# Patient Record
Sex: Female | Born: 1995 | Race: Black or African American | Hispanic: No | Marital: Single | Smoking: Former smoker
Health system: Southern US, Community
[De-identification: ages and names within clinical notes are randomized; demographics above are authoritative.]

## PROBLEM LIST (undated history)

## (undated) DIAGNOSIS — I82409 Acute embolism and thrombosis of unspecified deep veins of unspecified lower extremity: Secondary | ICD-10-CM

## (undated) DIAGNOSIS — D649 Anemia, unspecified: Secondary | ICD-10-CM

## (undated) DIAGNOSIS — J45909 Unspecified asthma, uncomplicated: Secondary | ICD-10-CM

## (undated) DIAGNOSIS — Z8619 Personal history of other infectious and parasitic diseases: Secondary | ICD-10-CM

## (undated) DIAGNOSIS — N12 Tubulo-interstitial nephritis, not specified as acute or chronic: Secondary | ICD-10-CM

## (undated) HISTORY — DX: Tubulo-interstitial nephritis, not specified as acute or chronic: N12

## (undated) HISTORY — DX: Anemia, unspecified: D64.9

## (undated) HISTORY — PX: NO PAST SURGERIES: SHX2092

## (undated) HISTORY — DX: Personal history of other infectious and parasitic diseases: Z86.19

---
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
## 2014-01-12 DIAGNOSIS — Z87440 Personal history of urinary (tract) infections: Secondary | ICD-10-CM | POA: Insufficient documentation

## 2014-01-12 DIAGNOSIS — Z86718 Personal history of other venous thrombosis and embolism: Secondary | ICD-10-CM | POA: Insufficient documentation

## 2014-06-07 DIAGNOSIS — O99013 Anemia complicating pregnancy, third trimester: Secondary | ICD-10-CM | POA: Insufficient documentation

## 2014-06-28 DIAGNOSIS — IMO0002 Reserved for concepts with insufficient information to code with codable children: Secondary | ICD-10-CM

## 2014-08-20 ENCOUNTER — Encounter: Payer: Self-pay | Admitting: Emergency Medicine

## 2014-08-20 ENCOUNTER — Emergency Department: Payer: Medicaid Other

## 2014-08-20 ENCOUNTER — Emergency Department
Admission: EM | Admit: 2014-08-20 | Discharge: 2014-08-21 | Disposition: A | Discharge status: Home / Self Care | Payer: Medicaid Other | Attending: Emergency Medicine | Admitting: Emergency Medicine

## 2014-08-20 DIAGNOSIS — Z3202 Encounter for pregnancy test, result negative: Secondary | ICD-10-CM | POA: Diagnosis not present

## 2014-08-20 DIAGNOSIS — R112 Nausea with vomiting, unspecified: Secondary | ICD-10-CM | POA: Insufficient documentation

## 2014-08-20 DIAGNOSIS — N12 Tubulo-interstitial nephritis, not specified as acute or chronic: Secondary | ICD-10-CM

## 2014-08-20 DIAGNOSIS — R509 Fever, unspecified: Secondary | ICD-10-CM

## 2014-08-20 LAB — URINALYSIS COMPLETE WITH MICROSCOPIC (ARMC ONLY)
Bilirubin Urine: NEGATIVE
Glucose, UA: NEGATIVE mg/dL
Ketones, ur: NEGATIVE mg/dL
Nitrite: POSITIVE — AB
PH: 5 (ref 5.0–8.0)
Protein, ur: 100 mg/dL — AB
Specific Gravity, Urine: 1.015 (ref 1.005–1.030)
TRANS EPITHEL UA: 2

## 2014-08-20 LAB — CBC
HCT: 36.4 % (ref 35.0–47.0)
Hemoglobin: 11 g/dL — ABNORMAL LOW (ref 12.0–16.0)
MCH: 22.6 pg — ABNORMAL LOW (ref 26.0–34.0)
MCHC: 30 g/dL — ABNORMAL LOW (ref 32.0–36.0)
MCV: 75.3 fL — ABNORMAL LOW (ref 80.0–100.0)
Platelets: 234 10*3/uL (ref 150–440)
RBC: 4.84 MIL/uL (ref 3.80–5.20)
RDW: 14.5 % (ref 11.5–14.5)
WBC: 12.4 10*3/uL — ABNORMAL HIGH (ref 3.6–11.0)

## 2014-08-20 LAB — BASIC METABOLIC PANEL
ANION GAP: 9 (ref 5–15)
BUN: 10 mg/dL (ref 6–20)
CO2: 25 mmol/L (ref 22–32)
CREATININE: 1 mg/dL (ref 0.44–1.00)
Calcium: 8.9 mg/dL (ref 8.9–10.3)
Chloride: 103 mmol/L (ref 101–111)
GFR calc Af Amer: >60 mL/min (ref >60)
GFR calc non Af Amer: >60 mL/min (ref >60)
Glucose, Bld: 102 mg/dL — ABNORMAL HIGH (ref 65–99)
POTASSIUM: 3.1 mmol/L — AB (ref 3.5–5.1)
SODIUM: 137 mmol/L (ref 135–145)

## 2014-08-20 LAB — POCT RAPID STREP A: Streptococcus, Group A Screen (Direct): NEGATIVE

## 2014-08-20 LAB — POCT PREGNANCY, URINE: PREG TEST UR: NEGATIVE

## 2014-08-20 MED ORDER — POTASSIUM CHLORIDE CRYS ER 20 MEQ PO TBCR
40.0000 meq | EXTENDED_RELEASE_TABLET | Freq: Once | ORAL | Status: AC
  Administered 2014-08-20: 40 meq via ORAL

## 2014-08-20 MED ORDER — ONDANSETRON HCL 4 MG/2ML IJ SOLN
4.0000 mg | Freq: Once | INTRAMUSCULAR | Status: AC
  Administered 2014-08-21: 4 mg via INTRAVENOUS

## 2014-08-20 MED ORDER — CEFTRIAXONE SODIUM IN DEXTROSE 40 MG/ML IV SOLN
2.0000 g | INTRAVENOUS | Status: DC
  Administered 2014-08-20: 2 g via INTRAVENOUS
  Filled 2014-08-20: qty 50

## 2014-08-20 MED ORDER — SODIUM CHLORIDE 0.9 % IV BOLUS (SEPSIS)
1000.0000 mL | Freq: Once | INTRAVENOUS | Status: AC
  Administered 2014-08-20: 1000 mL via INTRAVENOUS

## 2014-08-20 MED ORDER — ACETAMINOPHEN 500 MG PO TABS
ORAL_TABLET | ORAL | Status: AC
  Administered 2014-08-20: 1000 mg via ORAL
  Filled 2014-08-20: qty 2

## 2014-08-20 MED ORDER — ACETAMINOPHEN 500 MG PO TABS
1000.0000 mg | ORAL_TABLET | Freq: Once | ORAL | Status: AC
Start: 2014-08-20 — End: 2014-08-20
  Administered 2014-08-20: 1000 mg via ORAL

## 2014-08-20 NOTE — ED Notes (Signed)
Pt reports fever x 3 days.  Pt reports back pain, abd pain, HA.  Pt reports n/v, 3x in past 24 hours.  Pt denies diarrhea.  Pt denies respiratory sx.  Pt NAD at this time.

## 2014-08-20 NOTE — ED Provider Notes (Signed)
[facility] Emergency Department Provider Note  ____________________________________________  Time seen: Approximately 11:50 PM  I have reviewed the triage vital signs and the nursing notes.   HISTORY  Chief Complaint Fever    HPI Kathryn Johnston is a 19 y.o. female who presents to the ED with a 3 day history of fever, chills, lower back pain, abdominal pain, headache, 3 episodes of nausea and vomiting. Patient denies sick contacts. Denies cough, congestion, shortness of breath, chest pain, dysuria, diarrhea, recent tick bite.   Past medical history History of DVT   There are no active problems to display for this patient.   History reviewed. No pertinent past surgical history.  No current outpatient prescriptions on file.  Allergies Ibuprofen  History reviewed. No pertinent family history.  Social History History  Substance Use Topics  . Smoking status: Never Smoker   . Smokeless tobacco: Never Used  . Alcohol Use: No    Review of Systems Constitutional: Positive for fever/chills Eyes: No visual changes. ENT: No sore throat. Cardiovascular: Denies chest pain. Respiratory: Denies shortness of breath. Gastrointestinal: Positive for abdominal pain.  Positive for nausea and vomiting.  No diarrhea.  No constipation. Genitourinary: Negative for dysuria. Musculoskeletal: Positive for back pain. Skin: Negative for rash. Neurological: Positive for headaches. Negative for focal weakness or numbness.  10-point ROS otherwise negative.  ____________________________________________   PHYSICAL EXAM:  VITAL SIGNS: ED Triage Vitals  Enc Vitals Group     BP 08/20/14 1948 109/70 mmHg     Pulse Rate 08/20/14 1948 128     Resp 08/20/14 1948 18     Temp 08/20/14 1948 103.2 F (39.6 C)     Temp Source 08/20/14 1948 Oral     SpO2 08/20/14 1948 100 %     Weight 08/20/14 1948 119 lb (53.978 kg)     Height 08/20/14 1948 5\' 2"  (1.575 m)     Head  Cir --      Peak Flow --      Pain Score 08/20/14 1949 10     Pain Loc --      Pain Edu? --      Excl. in GC? --     Constitutional: Alert and oriented. Well appearing and in no acute distress. Eyes: Conjunctivae are normal. PERRL. EOMI. Head: Atraumatic. Nose: No congestion/rhinnorhea. Mouth/Throat: Mucous membranes are moist.  Oropharynx non-erythematous. Neck: No stridor. Supple neck without signs of meningismus. Cardiovascular: Normal rate, regular rhythm. Grossly normal heart sounds.  Good peripheral circulation. Respiratory: Normal respiratory effort.  No retractions. Lungs CTAB. Gastrointestinal: Soft and nontender. No distention. No abdominal bruits. No CVA tenderness. Musculoskeletal: No lower extremity tenderness nor edema.  No joint effusions. Neurologic:  Normal speech and language. No gross focal neurologic deficits are appreciated. Speech is normal. No gait instability. Skin:  Skin is warm, dry and intact. No rash noted. Psychiatric: Mood and affect are normal. Speech and behavior are normal.  ____________________________________________   LABS (all labs ordered are listed, but only abnormal results are displayed)  Labs Reviewed  CBC - Abnormal; Notable for the following:    WBC 12.4 (*)    Hemoglobin 11.0 (*)    MCV 75.3 (*)    MCH 22.6 (*)    MCHC 30.0 (*)    All other components within normal limits  BASIC METABOLIC PANEL - Abnormal; Notable for the following:    Potassium 3.1 (*)    Glucose, Bld 102 (*)    All other components within  normal limits  URINALYSIS COMPLETEWITH MICROSCOPIC ([facility] ONLY) - Abnormal; Notable for the following:    Color, Urine YELLOW (*)    APPearance CLOUDY (*)    Hgb urine dipstick 1+ (*)    Protein, ur 100 (*)    Nitrite POSITIVE (*)    Leukocytes, UA 3+ (*)    Bacteria, UA MANY (*)    Squamous Epithelial / LPF 6-30 (*)    All other components within normal limits  CULTURE, GROUP A STREP ([facility] ONLY)  URINE CULTURE  ROCKY  MTN SPOTTED FVR ABS PNL(IGG+IGM)  POC URINE PREG, ED  POCT RAPID STREP A  POCT PREGNANCY, URINE   ____________________________________________  EKG  None ____________________________________________  RADIOLOGY  Chest 2 view (viewed by me, interpreted by Dr. Cherly Hensen): No acute cardiopulmonary process seen.  ____________________________________________   PROCEDURES  Procedure(s) performed: None  Critical Care performed: No  ____________________________________________   INITIAL IMPRESSION / ASSESSMENT AND PLAN / ED COURSE  Pertinent labs & imaging results that were available during my care of the patient were reviewed by me and considered in my medical decision making (see chart for details).  19 year old female who presents with fever, chills, low back pain, nausea/vomiting with laboratory evidence of pyelonephritis and mild hypokalemia. Will infuse IV fluids, IV antiemetic, IV antibiotics and reassess.  ----------------------------------------- 2:08 AM on 08/21/2014 -----------------------------------------  Patient improved after IV morphine. Currently resting comfortably in no acute distress. IV fluids almost complete. Patient has tolerated PO, is currently afebrile and not hypotensive. Plan to discharge home on Septra and follow up with her primary care doctor. Strict return precautions given. Patient verbalizes understanding and agrees with plan of care. ____________________________________________   FINAL CLINICAL IMPRESSION(S) / ED DIAGNOSES  Final diagnoses:  Fever, unspecified fever cause  Pyelonephritis      Kathryn Hong, MD 08/21/14 [phone]

## 2014-08-20 NOTE — ED Notes (Signed)
EMS pt to triage via wheelchair. Pt reports body aches and fever for about 4 to 5 days. Pt reports she also has a non productive cough and feels weak.

## 2014-08-21 MED ORDER — ONDANSETRON HCL 4 MG/2ML IJ SOLN
4.0000 mg | Freq: Once | INTRAMUSCULAR | Status: AC
  Administered 2014-08-21: 4 mg via INTRAVENOUS

## 2014-08-21 MED ORDER — MORPHINE SULFATE 2 MG/ML IJ SOLN
INTRAMUSCULAR | Status: AC
  Administered 2014-08-21: 2 mg via INTRAVENOUS
  Filled 2014-08-21: qty 1

## 2014-08-21 MED ORDER — ONDANSETRON HCL 4 MG/2ML IJ SOLN
INTRAMUSCULAR | Status: AC
  Administered 2014-08-21: 4 mg via INTRAVENOUS
  Filled 2014-08-21: qty 2

## 2014-08-21 MED ORDER — ONDANSETRON HCL 4 MG PO TABS: 4.0000 mg | ORAL_TABLET | Freq: Three times a day (TID) | ORAL | Status: DC | PRN

## 2014-08-21 MED ORDER — HYDROCODONE-ACETAMINOPHEN 5-325 MG PO TABS: 1.0000 | ORAL_TABLET | Freq: Four times a day (QID) | ORAL | Status: DC | PRN

## 2014-08-21 MED ORDER — POTASSIUM CHLORIDE CRYS ER 20 MEQ PO TBCR
EXTENDED_RELEASE_TABLET | ORAL | Status: AC
  Administered 2014-08-20: 40 meq via ORAL
  Filled 2014-08-21: qty 2

## 2014-08-21 MED ORDER — MORPHINE SULFATE 2 MG/ML IJ SOLN
2.0000 mg | Freq: Once | INTRAMUSCULAR | Status: AC
  Administered 2014-08-21: 2 mg via INTRAVENOUS

## 2014-08-21 MED ORDER — SULFAMETHOXAZOLE-TRIMETHOPRIM 800-160 MG PO TABS: 1.0000 | ORAL_TABLET | Freq: Two times a day (BID) | ORAL | Status: DC

## 2014-08-21 NOTE — Discharge Instructions (Signed)
1. Take antibiotic as prescribed (Septra DS twice daily 7 days). 2. Take medicines as needed for pain and nausea (Norco/Zofran #20). 3. Take Tylenol as needed for fever greater than 100.56F. 4. Drink plenty of fluids daily. 5. Return to the ER for worsening symptoms, persistent vomiting, difficulty breathing or other concerns.  Fever, Adult A fever is a higher than normal body temperature. In an adult, an oral temperature around 98.6 F (37 C) is considered normal. A temperature of 100.4 F (38 C) or higher is generally considered a fever. Mild or moderate fevers generally have no long-term effects and often do not require treatment. Extreme fever (greater than or equal to 106 F or 41.1 C) can cause seizures. The sweating that may occur with repeated or prolonged fever may cause dehydration. Elderly people can develop confusion during a fever. A measured temperature can vary with:  Age.  Time of day.  Method of measurement (mouth, underarm, rectal, or ear). The fever is confirmed by taking a temperature with a thermometer. Temperatures can be taken different ways. Some methods are accurate and some are not.  An oral temperature is used most commonly. Electronic thermometers are fast and accurate.  An ear temperature will only be accurate if the thermometer is positioned as recommended by the manufacturer.  A rectal temperature is accurate and done for those adults who have a condition where an oral temperature cannot be taken.  An underarm (axillary) temperature is not accurate and not recommended. Fever is a symptom, not a disease.  CAUSES   Infections commonly cause fever.  Some noninfectious causes for fever include:  Some arthritis conditions.  Some thyroid or adrenal gland conditions.  Some immune system conditions.  Some types of cancer.  A medicine reaction.  High doses of certain street drugs such as methamphetamine.  Dehydration.  Exposure to high outside or  room temperatures.  Occasionally, the source of a fever cannot be determined. This is sometimes called a "fever of unknown origin" (FUO).  Some situations may lead to a temporary rise in body temperature that may go away on its own. Examples are:  Childbirth.  Surgery.  Intense exercise. HOME CARE INSTRUCTIONS   Take appropriate medicines for fever. Follow dosing instructions carefully. If you use acetaminophen to reduce the fever, be careful to avoid taking other medicines that also contain acetaminophen. Do not take aspirin for a fever if you are younger than age 519. There is an association with Reye's syndrome. Reye's syndrome is a rare but potentially deadly disease.  If an infection is present and antibiotics have been prescribed, take them as directed. Finish them even if you start to feel better.  Rest as needed.  Maintain an adequate fluid intake. To prevent dehydration during an illness with prolonged or recurrent fever, you may need to drink extra fluid.Drink enough fluids to keep your urine clear or pale yellow.  Sponging or bathing with room temperature water may help reduce body temperature. Do not use ice water or alcohol sponge baths.  Dress comfortably, but do not over-bundle. SEEK MEDICAL CARE IF:   You are unable to keep fluids down.  You develop vomiting or diarrhea.  You are not feeling at least partly better after 3 days.  You develop new symptoms or problems. SEEK IMMEDIATE MEDICAL CARE IF:   You have shortness of breath or trouble breathing.  You develop excessive weakness.  You are dizzy or you faint.  You are extremely thirsty or you are making little  or no urine.  You develop new pain that was not there before (such as in the head, neck, chest, back, or abdomen).  You have persistent vomiting and diarrhea for more than 1 to 2 days.  You develop a stiff neck or your eyes become sensitive to light.  You develop a skin rash.  You have a fever  or persistent symptoms for more than 2 to 3 days.  You have a fever and your symptoms suddenly get worse. MAKE SURE YOU:   Understand these instructions.  Will watch your condition.  Will get help right away if you are not doing well or get worse. Document Released: 08/01/2000 Document Revised: 06/22/2013 Document Reviewed: 12/07/2010 [facility] Patient Information 2015 [place], [place]. This information is not intended to replace advice given to you by your health care provider. Make sure you discuss any questions you have with your health care provider.  Pyelonephritis, Adult Pyelonephritis is a kidney infection. In general, there are 2 main types of pyelonephritis:  Infections that come on quickly without any warning (acute pyelonephritis).  Infections that persist for a long period of time (chronic pyelonephritis). CAUSES  Two main causes of pyelonephritis are:  Bacteria traveling from the bladder to the kidney. This is a problem especially in pregnant women. The urine in the bladder can become filled with bacteria from multiple causes, including:  Inflammation of the prostate gland (prostatitis).  Sexual intercourse in females.  Bladder infection (cystitis).  Bacteria traveling from the bloodstream to the tissue part of the kidney. Problems that may increase your risk of getting a kidney infection include:  Diabetes.  Kidney stones or bladder stones.  Cancer.  Catheters placed in the bladder.  Other abnormalities of the kidney or ureter. SYMPTOMS   Abdominal pain.  Pain in the side or flank area.  Fever.  Chills.  Upset stomach.  Blood in the urine (dark urine).  Frequent urination.  Strong or persistent urge to urinate.  Burning or stinging when urinating. DIAGNOSIS  Your caregiver may diagnose your kidney infection based on your symptoms. A urine sample may also be taken. TREATMENT  In general, treatment depends on how severe the infection is.    If the infection is mild and caught early, your caregiver may treat you with oral antibiotics and send you home.  If the infection is more severe, the bacteria may have gotten into the bloodstream. This will require intravenous (IV) antibiotics and a hospital stay. Symptoms may include:  High fever.  Severe flank pain.  Shaking chills.  Even after a hospital stay, your caregiver may require you to be on oral antibiotics for a period of time.  Other treatments may be required depending upon the cause of the infection. HOME CARE INSTRUCTIONS   Take your antibiotics as directed. Finish them even if you start to feel better.  Make an appointment to have your urine checked to make sure the infection is gone.  Drink enough fluids to keep your urine clear or pale yellow.  Take medicines for the bladder if you have urgency and frequency of urination as directed by your caregiver. SEEK IMMEDIATE MEDICAL CARE IF:   You have a fever or persistent symptoms for more than 2-3 days.  You have a fever and your symptoms suddenly get worse.  You are unable to take your antibiotics or fluids.  You develop shaking chills.  You experience extreme weakness or fainting.  There is no improvement after 2 days of treatment. MAKE SURE YOU:  Understand these instructions.  Will watch your condition.  Will get help right away if you are not doing well or get worse. Document Released: 02/05/2005 Document Revised: 08/07/2011 Document Reviewed: 07/12/2010 [facility] Patient Information 2015 [place]. This information is not intended to replace advice given to you by your health care provider. Make sure you discuss any questions you have with your health care provider.

## 2014-08-22 LAB — CULTURE, GROUP A STREP (THRC)

## 2014-08-23 LAB — URINE CULTURE
Culture: >=100000
Special Requests: NORMAL

## 2014-08-25 LAB — ROCKY MTN SPOTTED FVR ABS PNL(IGG+IGM)
RMSF IGG: NEGATIVE
RMSF IGM: 0.58 {index} (ref 0.00–0.89)

## 2014-11-17 ENCOUNTER — Encounter: Payer: Self-pay | Admitting: Emergency Medicine

## 2014-11-17 ENCOUNTER — Emergency Department
Admission: EM | Admit: 2014-11-17 | Discharge: 2014-11-18 | Disposition: A | Discharge status: Home / Self Care | Payer: Medicaid Other | Attending: Emergency Medicine | Admitting: Emergency Medicine

## 2014-11-17 DIAGNOSIS — N12 Tubulo-interstitial nephritis, not specified as acute or chronic: Secondary | ICD-10-CM | POA: Insufficient documentation

## 2014-11-17 DIAGNOSIS — Z792 Long term (current) use of antibiotics: Secondary | ICD-10-CM | POA: Insufficient documentation

## 2014-11-17 DIAGNOSIS — Z720 Tobacco use: Secondary | ICD-10-CM | POA: Insufficient documentation

## 2014-11-17 DIAGNOSIS — R109 Unspecified abdominal pain: Secondary | ICD-10-CM | POA: Diagnosis present

## 2014-11-17 DIAGNOSIS — Z3202 Encounter for pregnancy test, result negative: Secondary | ICD-10-CM | POA: Insufficient documentation

## 2014-11-17 HISTORY — DX: Unspecified asthma, uncomplicated: J45.909

## 2014-11-17 HISTORY — DX: Acute embolism and thrombosis of unspecified deep veins of unspecified lower extremity: I82.409

## 2014-11-17 LAB — BASIC METABOLIC PANEL
ANION GAP: 6 (ref 5–15)
BUN: <5 mg/dL — ABNORMAL LOW (ref 6–20)
CO2: 26 mmol/L (ref 22–32)
Calcium: 9.2 mg/dL (ref 8.9–10.3)
Chloride: 105 mmol/L (ref 101–111)
Creatinine, Ser: 0.92 mg/dL (ref 0.44–1.00)
GFR calc non Af Amer: >60 mL/min (ref >60)
GLUCOSE: 91 mg/dL (ref 65–99)
POTASSIUM: 3.5 mmol/L (ref 3.5–5.1)
Sodium: 137 mmol/L (ref 135–145)

## 2014-11-17 LAB — CBC
HEMATOCRIT: 37.6 % (ref 35.0–47.0)
HEMOGLOBIN: 11.9 g/dL — AB (ref 12.0–16.0)
MCH: 23.9 pg — ABNORMAL LOW (ref 26.0–34.0)
MCHC: 31.6 g/dL — ABNORMAL LOW (ref 32.0–36.0)
MCV: 75.7 fL — ABNORMAL LOW (ref 80.0–100.0)
Platelets: 213 10*3/uL (ref 150–440)
RBC: 4.97 MIL/uL (ref 3.80–5.20)
RDW: 13.9 % (ref 11.5–14.5)
WBC: 7 10*3/uL (ref 3.6–11.0)

## 2014-11-17 LAB — URINALYSIS COMPLETE WITH MICROSCOPIC (ARMC ONLY)
Bilirubin Urine: NEGATIVE
Glucose, UA: NEGATIVE mg/dL
KETONES UR: NEGATIVE mg/dL
Nitrite: POSITIVE — AB
PROTEIN: 30 mg/dL — AB
Specific Gravity, Urine: 1.011 (ref 1.005–1.030)
pH: 5 (ref 5.0–8.0)

## 2014-11-17 LAB — POCT PREGNANCY, URINE: Preg Test, Ur: NEGATIVE

## 2014-11-17 MED ORDER — MORPHINE SULFATE (PF) 2 MG/ML IV SOLN
2.0000 mg | Freq: Once | INTRAVENOUS | Status: AC
Start: 2014-11-17 — End: 2014-11-17
  Administered 2014-11-17: 2 mg via INTRAVENOUS
  Filled 2014-11-17: qty 1

## 2014-11-17 MED ORDER — DEXTROSE 5 % IV SOLN
1.0000 g | Freq: Once | INTRAVENOUS | Status: AC
  Administered 2014-11-17: via INTRAVENOUS
  Filled 2014-11-17: qty 10

## 2014-11-17 MED ORDER — ONDANSETRON HCL 4 MG/2ML IJ SOLN
4.0000 mg | Freq: Once | INTRAMUSCULAR | Status: AC
  Administered 2014-11-17: 4 mg via INTRAVENOUS
  Filled 2014-11-17: qty 2

## 2014-11-17 MED ORDER — IOHEXOL 240 MG/ML SOLN
25.0000 mL | INTRAMUSCULAR | Status: AC
  Administered 2014-11-17: 50 mL via ORAL

## 2014-11-17 NOTE — ED Provider Notes (Signed)
[facility] Emergency Department Provider Note  ____________________________________________  Time seen: 11:15 PM  I have reviewed the triage vital signs and the nursing notes.   HISTORY  Chief Complaint Flank Pain and Hematuria      HPI Kathryn Johnston is a 19 y.o. female presents with acute onset of right flank pain and hematuria 3 or 4 days per patient. Patient states she also had a fever at home with a MAXIMUM TEMPERATURE of 102. Patient also admits to nausea but no vomiting no diarrhea. Patient also has some dysuria and urinary frequency and urgency.     Past Medical History  Diagnosis Date  . DVT (deep venous thrombosis)     There are no active problems to display for this patient.   History reviewed. No pertinent past surgical history.  Current Outpatient Rx  Name  Route  Sig  Dispense  Refill  . HYDROcodone-acetaminophen (NORCO) 5-325 MG per tablet   Oral   Take 1 tablet by mouth every 6 (six) hours as needed for moderate pain.   15 tablet   0   . ondansetron (ZOFRAN) 4 MG tablet   Oral   Take 1 tablet (4 mg total) by mouth every 8 (eight) hours as needed for nausea or vomiting.   15 tablet   0   . sulfamethoxazole-trimethoprim (BACTRIM DS,SEPTRA DS) 800-160 MG per tablet   Oral   Take 1 tablet by mouth 2 (two) times daily.   14 tablet   0     Allergies Ibuprofen  No family history on file.  Social History Social History  Substance Use Topics  . Smoking status: Current Every Day Smoker -- 0.50 packs/day    Types: Cigarettes  . Smokeless tobacco: Never Used  . Alcohol Use: No    Review of Systems  Constitutional: Positive for fever. Eyes: Negative for visual changes. ENT: Negative for sore throat. Cardiovascular: Negative for chest pain. Respiratory: Negative for shortness of breath. Gastrointestinal: Negative for abdominal pain, vomiting and diarrhea. Genitourinary: Negative for dysuria. Musculoskeletal:  Positive for back pain. Skin: Negative for rash. Neurological: Negative for headaches, focal weakness or numbness.   10-point ROS otherwise negative.  ____________________________________________   PHYSICAL EXAM:  VITAL SIGNS: ED Triage Vitals  Enc Vitals Group     BP 11/17/14 2235 116/63 mmHg     Pulse Rate 11/17/14 2235 101     Resp 11/17/14 2235 18     Temp 11/17/14 2235 100.8 F (38.2 C)     Temp Source 11/17/14 2235 Oral     SpO2 11/17/14 2235 98 %     Weight 11/17/14 2235 119 lb (53.978 kg)     Height 11/17/14 2235  (1.575 m)     Head Cir --      Peak Flow --      Pain Score 11/17/14 2233 10     Pain Loc --      Pain Edu? --      Excl. in GC? --      Constitutional: Alert and oriented. Well appearing and in no distress. Eyes: Conjunctivae are normal. PERRL. Normal extraocular movements. ENT   Head: Normocephalic and atraumatic.   Nose: No congestion/rhinnorhea.   Mouth/Throat: Mucous membranes are moist.   Neck: No stridor. Cardiovascular: Normal rate, regular rhythm. Normal and symmetric distal pulses are present in all extremities. No murmurs, rubs, or gallops. Respiratory: Normal respiratory effort without tachypnea nor retractions. Breath sounds are clear and equal bilaterally. No wheezes/rales/rhonchi.  Gastrointestinal: Soft and nontender. No distention. Right CVA tenderness. Genitourinary: deferred Musculoskeletal: Nontender with normal range of motion in all extremities. No joint effusions.  No lower extremity tenderness nor edema. Neurologic:  Normal speech and language. No gross focal neurologic deficits are appreciated. Speech is normal.  Skin:  Skin is warm, dry and intact. No rash noted. Psychiatric: Mood and affect are normal. Speech and behavior are normal. Patient exhibits appropriate insight and judgment.  ____________________________________________    LABS (pertinent positives/negatives)  Labs Reviewed  URINALYSIS  COMPLETEWITH MICROSCOPIC ([facility] ONLY) - Abnormal; Notable for the following:    Color, Urine YELLOW (*)    APPearance HAZY (*)    Hgb urine dipstick 3+ (*)    Protein, ur 30 (*)    Nitrite POSITIVE (*)    Leukocytes, UA 3+ (*)    Bacteria, UA FEW (*)    Squamous Epithelial / LPF 6-30 (*)    All other components within normal limits  BASIC METABOLIC PANEL - Abnormal; Notable for the following:    BUN <5 (*)    All other components within normal limits  CBC - Abnormal; Notable for the following:    Hemoglobin 11.9 (*)    MCV 75.7 (*)    MCH 23.9 (*)    MCHC 31.6 (*)    All other components within normal limits  POC URINE PREG, ED  POCT PREGNANCY, URINE      RADIOLOGY     CT Abdomen Pelvis W Contrast (Final result) Result time: 11/18/14 01:38:54   Final result by Rad Results In Interface (11/18/14 01:38:54)   Narrative:   CLINICAL DATA: Pyelonephritis. Third episode since July. Patient reports right flank pain radiating into the right lower abdomen with hematuria and fever for 3-4 days.  EXAM: CT ABDOMEN AND PELVIS WITH CONTRAST  TECHNIQUE: Multidetector CT imaging of the abdomen and pelvis was performed using the standard protocol following bolus administration of intravenous contrast.  CONTRAST: 75mL OMNIPAQUE IOHEXOL 300 MG/ML SOLN  COMPARISON: None.  FINDINGS: The included lung bases are clear.  Heterogeneous enhancement of the upper right kidney with possible cortical scarring anteriorly. Mild enhancement and prominence of the mid right ureter. There is no nephrolithiasis. No intrarenal or perirenal abscess or fluid collection. Left kidney renal collecting system are normal.  The liver, gallbladder, spleen, pancreas, and adrenal glands are normal. The stomach is physiologically distended with contrast. There are no dilated or thickened bowel loops. Moderate stool burden in the right colon. No colonic wall thickening. The appendix is normal. No  free air, free fluid, or intra-abdominal fluid collection.  No retroperitoneal adenopathy. Abdominal aorta is normal in caliber.  Within the pelvis the uterus is retroverted. There is question of mild bladder wall thickening. The ovaries are not discretely visualized. Small amount of free fluid in the pelvis is physiologic in a patient of this age.  There are no acute or suspicious osseous abnormalities.  IMPRESSION: Findings suspicious for mild right pyelonephritis with questionable scarring in the upper right kidney. No abscess.   Electronically Signed By: Rubye Oaks M.D. On: 11/18/2014 01:38         INITIAL IMPRESSION / ASSESSMENT AND PLAN / ED COURSE  Pertinent labs & imaging results that were available during my care of the patient were reviewed by me and considered in my medical decision making (see chart for details).  Patient received IV ceftriaxone 1 g as well as IV morphine for analgesia. History of physical exam, CT scan consistent with acute  pyelonephritis. I reviewed the patient's last urine culture from July which revealed that she was sensitive to ciprofloxacin as such she will be prescribed the same for home.  ____________________________________________   FINAL CLINICAL IMPRESSION(S) / ED DIAGNOSES  Final diagnoses:  Pyelonephritis      Darci Current, MD 11/18/14 0225

## 2014-11-17 NOTE — ED Notes (Signed)
Patient with fever to 103 at home. Believes she may have a kidney infection. Has seen bright red blood in her urine.

## 2014-11-17 NOTE — ED Notes (Signed)
Pt to triage via w/c with no distress noted; pt reports right flank pain radiating into right lower abd accomp by hematuria and fever for 3-4 days

## 2014-11-18 ENCOUNTER — Emergency Department: Payer: Medicaid Other

## 2014-11-18 DIAGNOSIS — A419 Sepsis, unspecified organism: Secondary | ICD-10-CM | POA: Insufficient documentation

## 2014-11-18 DIAGNOSIS — N10 Acute tubulo-interstitial nephritis: Secondary | ICD-10-CM | POA: Insufficient documentation

## 2014-11-18 MED ORDER — OXYCODONE-ACETAMINOPHEN 5-325 MG PO TABS
2.0000 | ORAL_TABLET | Freq: Once | ORAL | Status: AC
  Administered 2014-11-18: 2 via ORAL
  Filled 2014-11-18: qty 2

## 2014-11-18 MED ORDER — CIPROFLOXACIN HCL 500 MG PO TABS: 500.0000 mg | ORAL_TABLET | Freq: Two times a day (BID) | ORAL | Status: AC

## 2014-11-18 MED ORDER — IOHEXOL 300 MG/ML  SOLN
75.0000 mL | Freq: Once | INTRAMUSCULAR | Status: AC | PRN
  Administered 2014-11-18: 75 mL via INTRAVENOUS

## 2014-11-18 NOTE — Discharge Instructions (Signed)
Pyelonephritis, Adult °Pyelonephritis is a kidney infection. In general, there are 2 main types of pyelonephritis: °· Infections that come on quickly without any warning (acute pyelonephritis). °· Infections that persist for a long period of time (chronic pyelonephritis). °CAUSES  °Two main causes of pyelonephritis are: °· Bacteria traveling from the bladder to the kidney. This is a problem especially in pregnant women. The urine in the bladder can become filled with bacteria from multiple causes, including: °¨ Inflammation of the prostate gland (prostatitis). °¨ Sexual intercourse in females. °¨ Bladder infection (cystitis). °· Bacteria traveling from the bloodstream to the tissue part of the kidney. °Problems that may increase your risk of getting a kidney infection include: °· Diabetes. °· Kidney stones or bladder stones. °· Cancer. °· Catheters placed in the bladder. °· Other abnormalities of the kidney or ureter. °SYMPTOMS  °· Abdominal pain. °· Pain in the side or flank area. °· Fever. °· Chills. °· Upset stomach. °· Blood in the urine (dark urine). °· Frequent urination. °· Strong or persistent urge to urinate. °· Burning or stinging when urinating. °DIAGNOSIS  °Your caregiver may diagnose your kidney infection based on your symptoms. A urine sample may also be taken. °TREATMENT  °In general, treatment depends on how severe the infection is.  °· If the infection is mild and caught early, your caregiver may treat you with oral antibiotics and send you home. °· If the infection is more severe, the bacteria may have gotten into the bloodstream. This will require intravenous (IV) antibiotics and a hospital stay. Symptoms may include: °¨ High fever. °¨ Severe flank pain. °¨ Shaking chills. °· Even after a hospital stay, your caregiver may require you to be on oral antibiotics for a period of time. °· Other treatments may be required depending upon the cause of the infection. °HOME CARE INSTRUCTIONS  °· Take your  antibiotics as directed. Finish them even if you start to feel better. °· Make an appointment to have your urine checked to make sure the infection is gone. °· Drink enough fluids to keep your urine clear or pale yellow. °· Take medicines for the bladder if you have urgency and frequency of urination as directed by your caregiver. °SEEK IMMEDIATE MEDICAL CARE IF:  °· You have a fever or persistent symptoms for more than 2-3 days. °· You have a fever and your symptoms suddenly get worse. °· You are unable to take your antibiotics or fluids. °· You develop shaking chills. °· You experience extreme weakness or fainting. °· There is no improvement after 2 days of treatment. °MAKE SURE YOU: °· Understand these instructions. °· Will watch your condition. °· Will get help right away if you are not doing well or get worse. °Document Released: 02/05/2005 Document Revised: 08/07/2011 Document Reviewed: 07/12/2010 °[employer]® Patient Information ©2015 [employer]. This information is not intended to replace advice given to you by your health care provider. Make sure you discuss any questions you have with your health care provider. ° °

## 2015-06-14 ENCOUNTER — Emergency Department
Admission: EM | Admit: 2015-06-14 | Discharge: 2015-06-14 | Disposition: A | Discharge status: Home / Self Care | Payer: Medicaid Other | Attending: Emergency Medicine | Admitting: Emergency Medicine

## 2015-06-14 ENCOUNTER — Encounter: Payer: Self-pay | Admitting: Emergency Medicine

## 2015-06-14 ENCOUNTER — Emergency Department: Payer: Medicaid Other

## 2015-06-14 DIAGNOSIS — J45909 Unspecified asthma, uncomplicated: Secondary | ICD-10-CM | POA: Insufficient documentation

## 2015-06-14 DIAGNOSIS — Z3A08 8 weeks gestation of pregnancy: Secondary | ICD-10-CM | POA: Insufficient documentation

## 2015-06-14 DIAGNOSIS — Z3490 Encounter for supervision of normal pregnancy, unspecified, unspecified trimester: Secondary | ICD-10-CM

## 2015-06-14 DIAGNOSIS — N12 Tubulo-interstitial nephritis, not specified as acute or chronic: Secondary | ICD-10-CM | POA: Diagnosis not present

## 2015-06-14 DIAGNOSIS — O26831 Pregnancy related renal disease, first trimester: Secondary | ICD-10-CM | POA: Diagnosis not present

## 2015-06-14 DIAGNOSIS — M549 Dorsalgia, unspecified: Secondary | ICD-10-CM

## 2015-06-14 DIAGNOSIS — Z86718 Personal history of other venous thrombosis and embolism: Secondary | ICD-10-CM | POA: Diagnosis not present

## 2015-06-14 DIAGNOSIS — F17210 Nicotine dependence, cigarettes, uncomplicated: Secondary | ICD-10-CM | POA: Insufficient documentation

## 2015-06-14 LAB — CBC
HEMATOCRIT: 33.3 % — AB (ref 35.0–47.0)
Hemoglobin: 10.7 g/dL — ABNORMAL LOW (ref 12.0–16.0)
MCH: 23.8 pg — AB (ref 26.0–34.0)
MCHC: 32.1 g/dL (ref 32.0–36.0)
MCV: 74.1 fL — AB (ref 80.0–100.0)
PLATELETS: 268 10*3/uL (ref 150–440)
RBC: 4.49 MIL/uL (ref 3.80–5.20)
RDW: 12.1 % (ref 11.5–14.5)
WBC: 6.2 10*3/uL (ref 3.6–11.0)

## 2015-06-14 LAB — POCT PREGNANCY, URINE: PREG TEST UR: POSITIVE — AB

## 2015-06-14 LAB — BASIC METABOLIC PANEL
ANION GAP: 6 (ref 5–15)
BUN: 10 mg/dL (ref 6–20)
CHLORIDE: 105 mmol/L (ref 101–111)
CO2: 26 mmol/L (ref 22–32)
Calcium: 9.3 mg/dL (ref 8.9–10.3)
Creatinine, Ser: 0.63 mg/dL (ref 0.44–1.00)
GFR calc Af Amer: >60 mL/min (ref >60)
GFR calc non Af Amer: >60 mL/min (ref >60)
GLUCOSE: 91 mg/dL (ref 65–99)
POTASSIUM: 3.5 mmol/L (ref 3.5–5.1)
SODIUM: 137 mmol/L (ref 135–145)

## 2015-06-14 LAB — URINALYSIS COMPLETE WITH MICROSCOPIC (ARMC ONLY)
BILIRUBIN URINE: NEGATIVE
GLUCOSE, UA: NEGATIVE mg/dL
HGB URINE DIPSTICK: NEGATIVE
Nitrite: NEGATIVE
PH: 7 (ref 5.0–8.0)
Protein, ur: 100 mg/dL — AB
Specific Gravity, Urine: 1.024 (ref 1.005–1.030)

## 2015-06-14 LAB — HCG, QUANTITATIVE, PREGNANCY: hCG, Beta Chain, Quant, S: 190149 m[IU]/mL — ABNORMAL HIGH (ref <5)

## 2015-06-14 MED ORDER — OXYCODONE-ACETAMINOPHEN 5-325 MG PO TABS: 1.0000 | ORAL_TABLET | Freq: Four times a day (QID) | ORAL | Status: DC | PRN

## 2015-06-14 MED ORDER — CEPHALEXIN 500 MG PO CAPS
500.0000 mg | ORAL_CAPSULE | Freq: Four times a day (QID) | ORAL | Status: AC
Start: 2015-06-14 — End: 2015-06-24

## 2015-06-14 MED ORDER — OXYCODONE-ACETAMINOPHEN 5-325 MG PO TABS
1.0000 | ORAL_TABLET | Freq: Once | ORAL | Status: AC
  Administered 2015-06-14: 1 via ORAL
  Filled 2015-06-14: qty 1

## 2015-06-14 MED ORDER — SODIUM CHLORIDE 0.9 % IV BOLUS (SEPSIS)
1000.0000 mL | Freq: Once | INTRAVENOUS | Status: AC
  Administered 2015-06-14: 1000 mL via INTRAVENOUS

## 2015-06-14 MED ORDER — CEFTRIAXONE SODIUM 1 G IJ SOLR
1.0000 g | Freq: Once | INTRAMUSCULAR | Status: AC
  Administered 2015-06-14: 1 g via INTRAVENOUS
  Filled 2015-06-14: qty 10

## 2015-06-14 NOTE — ED Provider Notes (Signed)
[facility] Emergency Department Provider Note  ____________________________________________  Time seen: Approximately 447 AM  I have reviewed the triage vital signs and the nursing notes.   HISTORY  Chief Complaint Back Pain    HPI Kathryn Johnston is a 20 y.o. female who comes into the hospital today with a concern that she may have a kidney infection. She is having some pain to her right back and her side. She reports that she had a fever earlier this morning 103 at home. She reports that she took some Tylenol and didn't have any problems with temperature for the rest of the day. The patient reports though that she can't get the pain to go away. She reports the pain is on the right side. The patient does not have any pain with urination but reports that whenever she sees not a whole lot comes out. The patient is pregnant but she is unsure how far along she is. Her last menstrual period was on Feb 25. The patient has not seen her OB/GYN and she is a G3 P2. She denies any nausea or vomiting denies any vaginal bleeding or vaginal discharge.   Past Medical History  Diagnosis Date  . DVT (deep venous thrombosis) (HCC)   . Asthma     There are no active problems to display for this patient.   History reviewed. No pertinent past surgical history.  No current outpatient prescriptions  Allergies Ibuprofen  No family history on file.  Social History Social History  Substance Use Topics  . Smoking status: Current Every Day Smoker -- 0.50 packs/day    Types: Cigarettes  . Smokeless tobacco: Never Used  . Alcohol Use: No    Review of Systems Constitutional:  fever/chills Eyes: No visual changes. ENT: No sore throat. Cardiovascular: Denies chest pain. Respiratory: Denies shortness of breath. Gastrointestinal: No abdominal pain.  No nausea, no vomiting.  No diarrhea.  No constipation. Genitourinary: Negative for dysuria. Musculoskeletal: right back  pain. Skin: Negative for rash. Neurological: Negative for headaches, focal weakness or numbness.  10-point ROS otherwise negative.  ____________________________________________   PHYSICAL EXAM:  VITAL SIGNS: ED Triage Vitals  Enc Vitals Group     BP 06/14/15 0315 120/51 mmHg     Pulse Rate 06/14/15 0315 77     Resp 06/14/15 0315 18     Temp 06/14/15 0315 98 F (36.7 C)     Temp Source 06/14/15 0315 Oral     SpO2 06/14/15 0315 98 %     Weight 06/14/15 0315 120 lb (54.432 kg)     Height 06/14/15 0315 5\' 2"  (1.575 m)     Head Cir --      Peak Flow --      Pain Score 06/14/15 0315 10     Pain Loc --      Pain Edu? --      Excl. in GC? --     Constitutional: Alert and oriented. Well appearing and in mild distress. Eyes: Conjunctivae are normal. PERRL. EOMI. Head: Atraumatic. Nose: No congestion/rhinnorhea. Mouth/Throat: Mucous membranes are moist.  Oropharynx non-erythematous. Cardiovascular: Normal rate, regular rhythm. Grossly normal heart sounds.  Good peripheral circulation. Respiratory: Normal respiratory effort.  No retractions. Lungs CTAB. Gastrointestinal: Soft and nontender. No distention. Positive bowel sounds, R CVA tenderness to palpation Musculoskeletal: No lower extremity tenderness nor edema.  No joint effusions. Neurologic:  Normal speech and language. Skin:  Skin is warm, dry and intact. Marland Kitchen. Psychiatric: Mood and affect are normal.  ____________________________________________   LABS (all labs ordered are listed, but only abnormal results are displayed)  Labs Reviewed  URINALYSIS COMPLETEWITH MICROSCOPIC ([facility] ONLY) - Abnormal; Notable for the following:    Color, Urine AMBER (*)    APPearance CLOUDY (*)    Ketones, ur TRACE (*)    Protein, ur 100 (*)    Leukocytes, UA 1+ (*)    Bacteria, UA MANY (*)    Squamous Epithelial / LPF 6-30 (*)    All other components within normal limits  HCG, QUANTITATIVE, PREGNANCY - Abnormal; Notable for the  following:    hCG, Beta Chain, Quant, S 161096 (*)    All other components within normal limits  CBC - Abnormal; Notable for the following:    Hemoglobin 10.7 (*)    HCT 33.3 (*)    MCV 74.1 (*)    MCH 23.8 (*)    All other components within normal limits  POCT PREGNANCY, URINE - Abnormal; Notable for the following:    Preg Test, Ur POSITIVE (*)    All other components within normal limits  URINE CULTURE  BASIC METABOLIC PANEL   ____________________________________________  EKG  none ____________________________________________  RADIOLOGY  US pelvis: Single living intrauterine pregnancy measuring 8 weeks 4 days. No adverse finding. ____________________________________________   PROCEDURES  Procedure(s) performed: None  Critical Care performed: No  ____________________________________________   INITIAL IMPRESSION / ASSESSMENT AND PLAN / ED COURSE  Pertinent labs & imaging results that were available during my care of the patient were reviewed by me and considered in my medical decision making (see chart for details).  This is a 20 year old female who comes to the hospital today with some right sided flank pain. The patient is concerned that she may have a UTI. The patient has had UTIs multiple times in the past. The caveat that the patient is pregnant. She is unsure of her dates I did perform an ultrasound to evaluate her. The patient is 8 weeks 4 days with no other complications. I did give her dose of ceftriaxone as well as a liter of normal saline. I contacted Dr. Elza Rafter given the patient's pregnant status and Dr. Valentino Saxon explained that if the patient's white count is normal, she is not afebrile and she is not vomiting at this moment she may be discharged home to attempt outpatient treatment of her kidney infection. She reports though that should she start having more fevers or have any vomiting she needs to come back into the hospital for IV antibiotics. I discussed this  with the patient and she understands and agrees. She did receive a dose of Percocet for her pain. The patient will be discharged home to follow-up with Dr. Valentino Saxon in clinic. ____________________________________________   FINAL CLINICAL IMPRESSION(S) / ED DIAGNOSES  Final diagnoses:  Right-sided back pain  Pregnancy  Pyelonephritis      Rebecka Apley, MD 06/14/15 0710

## 2015-06-14 NOTE — ED Notes (Addendum)
Patient ambulatory to triage with steady gait, without difficulty or distress noted; pt reports right lower back/side pain x 2 days with no accomp symptoms; denies any abd pain; st approx 7wks pregnancy; G3 P2; st hx UTI's

## 2015-06-14 NOTE — ED Notes (Signed)
Pt presents to ED with c/o increased urinary frequency x 3 days. Pt reports [redacted] weeks pregnant. Pt report pain lower right side back pain. Pt alert and oriented x 4, no increased work in breathing, skin warm and dry. Pt denies chest pain, shortness of breath, abdominal pain.

## 2015-06-14 NOTE — Discharge Instructions (Signed)
First Trimester of Pregnancy  The first trimester of pregnancy is from week 1 until the end of week 12 (months 1 through 3). A week after a sperm fertilizes an egg, the egg will implant on the wall of the uterus. This embryo will begin to develop into a baby. Genes from you and your partner are forming the baby. The female genes determine whether the baby is a boy or a girl. At 6-8 weeks, the eyes and face are formed, and the heartbeat can be seen on ultrasound. At the end of 12 weeks, all the baby's organs are formed.   Now that you are pregnant, you will want to do everything you can to have a healthy baby. Two of the most important things are to get good prenatal care and to follow your health care provider's instructions. Prenatal care is all the medical care you receive before the baby's birth. This care will help prevent, find, and treat any problems during the pregnancy and childbirth.  BODY CHANGES  Your body goes through many changes during pregnancy. The changes vary from woman to woman.   · You may gain or lose a couple of pounds at first.  · You may feel sick to your stomach (nauseous) and throw up (vomit). If the vomiting is uncontrollable, call your health care provider.  · You may tire easily.  · You may develop headaches that can be relieved by medicines approved by your health care provider.  · You may urinate more often. Painful urination may mean you have a bladder infection.  · You may develop heartburn as a result of your pregnancy.  · You may develop constipation because certain hormones are causing the muscles that push waste through your intestines to slow down.  · You may develop hemorrhoids or swollen, bulging veins (varicose veins).  · Your breasts may begin to grow larger and become tender. Your nipples may stick out more, and the tissue that surrounds them (areola) may become darker.  · Your gums may bleed and may be sensitive to brushing and flossing.   · Dark spots or blotches (chloasma, mask of pregnancy) may develop on your face. This will likely fade after the baby is born.  · Your menstrual periods will stop.  · You may have a loss of appetite.  · You may develop cravings for certain kinds of food.  · You may have changes in your emotions from day to day, such as being excited to be pregnant or being concerned that something may go wrong with the pregnancy and baby.  · You may have more vivid and strange dreams.  · You may have changes in your hair. These can include thickening of your hair, rapid growth, and changes in texture. Some women also have hair loss during or after pregnancy, or hair that feels dry or thin. Your hair will most likely return to normal after your baby is born.  WHAT TO EXPECT AT YOUR PRENATAL VISITS  During a routine prenatal visit:  · You will be weighed to make sure you and the baby are growing normally.  · Your blood pressure will be taken.  · Your abdomen will be measured to track your baby's growth.  · The fetal heartbeat will be listened to starting around week 10 or 12 of your pregnancy.  · Test results from any previous visits will be discussed.  Your health care provider may ask you:  · How you are feeling.  · If you   including cigarettes, chewing tobacco, and electronic cigarettes. °· If you have any questions. °Other tests that may be performed during your first trimester include: °· Blood tests to find your blood type and to check for the presence of any previous infections. They will also be used to check for low iron levels (anemia) and Rh antibodies. Later in the pregnancy, blood tests for diabetes will be done along with other tests if problems develop. °· Urine tests to check for infections, diabetes, or protein in the urine. °· An ultrasound to confirm the proper growth  and development of the baby. °· An amniocentesis to check for possible genetic problems. °· Fetal screens for spina bifida and Down syndrome. °· You may need other tests to make sure you and the baby are doing well. °· HIV (human immunodeficiency virus) testing. Routine prenatal testing includes screening for HIV, unless you choose not to have this test. °HOME CARE INSTRUCTIONS  °Medicines °· Follow your health care provider's instructions regarding medicine use. Specific medicines may be either safe or unsafe to take during pregnancy. °· Take your prenatal vitamins as directed. °· If you develop constipation, try taking a stool softener if your health care provider approves. °Diet °· Eat regular, well-balanced meals. Choose a variety of foods, such as meat or vegetable-based protein, fish, milk and low-fat dairy products, vegetables, fruits, and whole grain breads and cereals. Your health care provider will help you determine the amount of weight gain that is right for you. °· Avoid raw meat and uncooked cheese. These carry germs that can cause birth defects in the baby. °· Eating four or five small meals rather than three large meals a day may help relieve nausea and vomiting. If you start to feel nauseous, eating a few soda crackers can be helpful. Drinking liquids between meals instead of during meals also seems to help nausea and vomiting. °· If you develop constipation, eat more high-fiber foods, such as fresh vegetables or fruit and whole grains. Drink enough fluids to keep your urine clear or pale yellow. °Activity and Exercise °· Exercise only as directed by your health care provider. Exercising will help you: °· Control your weight. °· Stay in shape. °· Be prepared for labor and delivery. °· Experiencing pain or cramping in the lower abdomen or low back is a good sign that you should stop exercising. Check with your health care provider before continuing normal exercises. °· Try to avoid standing for long  periods of time. Move your legs often if you must stand in one place for a long time. °· Avoid heavy lifting. °· Wear low-heeled shoes, and practice good posture. °· You may continue to have sex unless your health care provider directs you otherwise. °Relief of Pain or Discomfort °· Wear a good support bra for breast tenderness.   °· Take warm sitz baths to soothe any pain or discomfort caused by hemorrhoids. Use hemorrhoid cream if your health care provider approves.   °· Rest with your legs elevated if you have leg cramps or low back pain. °· If you develop varicose veins in your legs, wear support hose. Elevate your feet for 15 minutes, 3-4 times a day. Limit salt in your diet. °Prenatal Care °· Schedule your prenatal visits by the twelfth week of pregnancy. They are usually scheduled monthly at first, then more often in the last 2 months before delivery. °· Write down your questions. Take them to your prenatal visits. °· Keep all your prenatal visits as directed by your   health care provider. Safety  Wear your seat belt at all times when driving.  Make a list of emergency phone numbers, including numbers for family, friends, the hospital, and police and fire departments. General Tips  Ask your health care provider for a referral to a local prenatal education class. Begin classes no later than at the beginning of month 6 of your pregnancy.  Ask for help if you have counseling or nutritional needs during pregnancy. Your health care provider can offer advice or refer you to specialists for help with various needs.  Do not use hot tubs, steam rooms, or saunas.  Do not douche or use tampons or scented sanitary pads.  Do not cross your legs for long periods of time.  Avoid cat litter boxes and soil used by cats. These carry germs that can cause birth defects in the baby and possibly loss of the fetus by miscarriage or stillbirth.  Avoid all smoking, herbs, alcohol, and medicines not prescribed by  your health care provider. Chemicals in these affect the formation and growth of the baby.  Do not use any tobacco products, including cigarettes, chewing tobacco, and electronic cigarettes. If you need help quitting, ask your health care provider. You may receive counseling support and other resources to help you quit.  Schedule a dentist appointment. At home, brush your teeth with a soft toothbrush and be gentle when you floss. SEEK MEDICAL CARE IF:   You have dizziness.  You have mild pelvic cramps, pelvic pressure, or nagging pain in the abdominal area.  You have persistent nausea, vomiting, or diarrhea.  You have a bad smelling vaginal discharge.  You have pain with urination.  You notice increased swelling in your face, hands, legs, or ankles. SEEK IMMEDIATE MEDICAL CARE IF:   You have a fever.  You are leaking fluid from your vagina.  You have spotting or bleeding from your vagina.  You have severe abdominal cramping or pain.  You have rapid weight gain or loss.  You vomit blood or material that looks like coffee grounds.  You are exposed to [place] measles and have never had them.  You are exposed to fifth disease or chickenpox.  You develop a severe headache.  You have shortness of breath.  You have any kind of trauma, such as from a fall or a car accident.   This information is not intended to replace advice given to you by your health care provider. Make sure you discuss any questions you have with your health care provider.   Document Released: 01/30/2001 Document Revised: 02/26/2014 Document Reviewed: 12/16/2012 Elsevier Interactive Patient Education 2016 [employer]  Pyelonephritis, Adult Pyelonephritis is a kidney infection. The kidneys are the organs that filter a person's blood and move waste out of the bloodstream and into the urine. Urine passes from the kidneys, through the ureters, and into the bladder. There are two main types of  pyelonephritis:  Infections that come on quickly without any warning (acute pyelonephritis).  Infections that last for a long period of time (chronic pyelonephritis). In most cases, the infection clears up with treatment and does not cause further problems. More severe infections or chronic infections can sometimes spread to the bloodstream or lead to other problems with the kidneys. CAUSES This condition is usually caused by:  Bacteria traveling from the bladder to the kidney through infected urine. The urine in the bladder can become infected with bacteria from:  Bladder infection (cystitis).  Inflammation of the prostate gland (prostatitis).  Sexual intercourse, in females.  Bacteria traveling from the bloodstream to the kidney. RISK FACTORS This condition is more likely to develop in:  Pregnant women.  Older people.  People who have diabetes.  People who have kidney stones or bladder stones.  People who have other abnormalities of the kidney or ureter.  People who have a catheter placed in the bladder.  People who have cancer.  People who are sexually active.  Women who use spermicides.  People who have had a prior urinary tract infection. SYMPTOMS Symptoms of this condition include:  Frequent urination.  Strong or persistent urge to urinate.  Burning or stinging when urinating.  Abdominal pain.  Back pain.  Pain in the side or flank area.  Fever.  Chills.  Blood in the urine, or dark urine.  Nausea.  Vomiting. DIAGNOSIS This condition may be diagnosed based on:  Medical history and physical exam.  Urine tests.  Blood tests. You may also have imaging tests of the kidneys, such as an ultrasound or CT scan. TREATMENT Treatment for this condition may depend on the severity of the infection.  If the infection is mild and is found early, you may be treated with antibiotic medicines taken by mouth. You will need to drink fluids to remain  hydrated.  If the infection is more severe, you may need to stay in the hospital and receive antibiotics given directly into a vein through an IV tube. You may also need to receive fluids through an IV tube if you are not able to remain hydrated. After your hospital stay, you may need to take oral antibiotics for a period of time. Other treatments may be required, depending on the cause of the infection. HOME CARE INSTRUCTIONS Medicines  Take over-the-counter and prescription medicines only as told by your health care provider.  If you were prescribed an antibiotic medicine, take it as told by your health care provider. Do not stop taking the antibiotic even if you start to feel better. General Instructions  Drink enough fluid to keep your urine clear or pale yellow.  Avoid caffeine, tea, and carbonated beverages. They tend to irritate the bladder.  Urinate often. Avoid holding in urine for long periods of time.  Urinate before and after sex.  After a bowel movement, women should cleanse from front to back. Use each tissue only once.  Keep all follow-up visits as told by your health care provider. This is important. SEEK MEDICAL CARE IF:  Your symptoms do not get better after 2 days of treatment.  Your symptoms get worse.  You have a fever. SEEK IMMEDIATE MEDICAL CARE IF:  You are unable to take your antibiotics or fluids.  You have shaking chills.  You vomit.  You have severe flank or back pain.  You have extreme weakness or fainting.   This information is not intended to replace advice given to you by your health care provider. Make sure you discuss any questions you have with your health care provider.   Document Released: 02/05/2005 Document Revised: 10/27/2014 Document Reviewed: 05/31/2014 Elsevier Interactive Patient Education [employer]

## 2015-06-14 NOTE — ED Notes (Signed)
Patient transported to Ultrasound via stretcher 

## 2015-06-16 LAB — URINE CULTURE: Culture: >=100000 — AB

## 2015-07-08 ENCOUNTER — Ambulatory Visit (INDEPENDENT_AMBULATORY_CARE_PROVIDER_SITE_OTHER): Payer: Medicaid Other | Admitting: Obstetrics and Gynecology

## 2015-07-08 VITALS — BP 96/60 | HR 101 | Wt 119.4 lb

## 2015-07-08 DIAGNOSIS — Z86718 Personal history of other venous thrombosis and embolism: Secondary | ICD-10-CM

## 2015-07-08 DIAGNOSIS — Z3491 Encounter for supervision of normal pregnancy, unspecified, first trimester: Secondary | ICD-10-CM

## 2015-07-08 DIAGNOSIS — Z862 Personal history of diseases of the blood and blood-forming organs and certain disorders involving the immune mechanism: Secondary | ICD-10-CM

## 2015-07-08 DIAGNOSIS — Z8759 Personal history of other complications of pregnancy, childbirth and the puerperium: Secondary | ICD-10-CM

## 2015-07-08 MED ORDER — CONCEPT DHA 53.5-38-1 MG PO CAPS: 53.5000 mg | ORAL_CAPSULE | Freq: Every day | ORAL | Status: DC

## 2015-07-08 NOTE — Patient Instructions (Signed)
Pregnancy and Zika Virus Disease Zika virus disease, or Zika, is an illness that can spread to people from mosquitoes that carry the virus. It may also spread from person to person through infected body fluids. Zika first occurred in [place], but recently it has spread to new areas. The virus occurs in tropical climates. The location of Zika continues to change. Most people who become infected with Zika virus do not develop serious illness. However, Zika may cause birth defects in an unborn baby whose mother is infected with the virus. It may also increase the risk of miscarriage. WHAT ARE THE SYMPTOMS OF ZIKA VIRUS DISEASE? In many cases, people who have been infected with Zika virus do not develop any symptoms. If symptoms appear, they usually start about a week after the person is infected. Symptoms are usually mild. They may include:  Fever.  Rash.  Red eyes.  Joint pain. HOW DOES ZIKA VIRUS DISEASE SPREAD? The main way that Zika virus spreads is through the bite of a certain type of mosquito. Unlike most types of mosquitos, which bite only at night, the type of mosquito that carries Zika virus bites both at night and during the day. Zika virus can also spread through sexual contact, through a blood transfusion, and from a mother to her baby before or during birth. Once you have had Zika virus disease, it is unlikely that you will get it again. CAN I PASS ZIKA TO MY BABY DURING PREGNANCY? Yes, Zika can pass from a mother to her baby before or during birth. WHAT PROBLEMS CAN ZIKA CAUSE FOR MY BABY? A woman who is infected with Zika virus while pregnant is at risk of having her baby born with a condition in which the brain or head is smaller than expected (microcephaly). Babies who have microcephaly can have developmental delays, seizures, hearing problems, and vision problems. Having Zika virus disease during pregnancy can also increase the risk of miscarriage. HOW CAN ZIKA VIRUS DISEASE BE  PREVENTED? There is no vaccine to prevent Zika. The best way to prevent the disease is to avoid infected mosquitoes and avoid exposure to body fluids that can spread the virus. Avoid any possible exposure to Zika by taking the following precautions. For women and their sex partners:  Avoid traveling to high-risk areas. The locations where Zika is being reported change often. To identify high-risk areas, check the CDC travel website: www.cdc.gov/zika/geo/index.html  If you or your sex partner must travel to a high-risk area, talk with a health care provider before and after traveling.  Take all precautions to avoid mosquito bites if you live in, or travel to, any of the high-risk areas. Insect repellents are safe to use during pregnancy.  Ask your health care provider when it is safe to have sexual contact. For women:  If you are pregnant or trying to become pregnant, avoid sexual contact with persons who may have been exposed to Zika virus, persons who have possible symptoms of Zika, or persons whose history you are unsure about. If you choose to have sexual contact with someone who may have been exposed to Zika virus, use condoms correctly during the entire duration of sexual activity, every time. Do not share sexual devices, as you may be exposed to body fluids.  Ask your health care provider about when it is safe to attempt pregnancy after a possible exposure to Zika virus. WHAT STEPS SHOULD I TAKE TO AVOID MOSQUITO BITES? Take these steps to avoid mosquito bites when you are   in a high-risk area:  Wear loose clothing that covers your arms and legs.  Limit your outdoor activities.  Do not open windows unless they have window screens.  Sleep under mosquito nets.  Use insect repellent. The best insect repellents have:  DEET, picaridin, oil of lemon eucalyptus (OLE), or IR3535 in them.  Higher amounts of an active ingredient in them.  Remember that insect repellents are safe to use  during pregnancy.  Do not use OLE on children who are younger than 36 years of age. Do not use insect repellent on babies who are younger than 58 months of age.  Cover your child's stroller with mosquito netting. Make sure the netting fits snugly and that any loose netting does not cover your child's mouth or nose. Do not use a blanket as a mosquito-protection cover.  Do not apply insect repellent underneath clothing.  If you are using sunscreen, apply the sunscreen before applying the insect repellent.  Treat clothing with permethrin. Do not apply permethrin directly to your skin. Follow label directions for safe use.  Get rid of standing water, where mosquitoes may reproduce. Standing water is often found in items such as buckets, bowls, animal food dishes, and flowerpots. When you return from traveling to any high-risk area, continue taking actions to protect yourself against mosquito bites for 3 weeks, even if you show no signs of illness. This will prevent spreading Zika virus to uninfected mosquitoes. WHAT SHOULD I KNOW ABOUT THE SEXUAL TRANSMISSION OF ZIKA? People can spread Zika to their sexual partners during vaginal, anal, or oral sex, or by sharing sexual devices. Many people with [place] do not develop symptoms, so a person could spread the disease without knowing that they are infected. The greatest risk is to women who are pregnant or who may become pregnant. Zika virus can live longer in semen than it can live in blood. Couples can prevent sexual transmission of the virus by:  Using condoms correctly during the entire duration of sexual activity, every time. This includes vaginal, anal, and oral sex.  Not sharing sexual devices. Sharing increases your risk of being exposed to body fluid from another person.  Avoiding all sexual activity until your health care provider says it is safe. SHOULD I BE TESTED FOR ZIKA VIRUS? A sample of your blood can be tested for Zika virus. A pregnant  woman should be tested if she may have been exposed to the virus or if she has symptoms of Zika. She may also have additional tests done during her pregnancy, such ultrasound testing. Talk with your health care provider about which tests are recommended.   This information is not intended to replace advice given to you by your health care provider. Make sure you discuss any questions you have with your health care provider.   Document Released: 10/27/2014 Document Reviewed: 10/20/2014 Elsevier Interactive Patient Education [employer] How a Baby Grows During Pregnancy Pregnancy begins when a female's sperm enters a female's egg (fertilization). This happens in one of the tubes (fallopian tubes) that connect the ovaries to the womb (uterus). The fertilized egg is called an embryo until it reaches 10 weeks. From 10 weeks until birth, it is called a fetus. The fertilized egg moves down the fallopian tube to the uterus. Then it implants into the lining of the uterus and begins to grow. The developing fetus receives oxygen and nutrients through the pregnant woman's bloodstream and the tissues that grow (placenta) to support the fetus. The placenta  is the life support system for the fetus. It provides nutrition and removes waste. Learning as much as you can about your pregnancy and how your baby is developing can help you enjoy the experience. It can also make you aware of when there might be a problem and when to ask questions. HOW LONG DOES A TYPICAL PREGNANCY LAST? A pregnancy usually lasts 280 days, or about 40 weeks. Pregnancy is divided into three trimesters:  First trimester: 0-13 weeks.  Second trimester: 14-27 weeks.  Third trimester: 28-40 weeks. The day when your baby is considered ready to be born (full term) is your estimated date of delivery. HOW DOES MY BABY DEVELOP MONTH BY MONTH? First month  The fertilized egg attaches to the inside of the uterus.  Some cells will form the  placenta. Others will form the fetus.  The arms, legs, brain, spinal cord, lungs, and heart begin to develop.  At the end of the first month, the heart begins to beat. Second month  The bones, inner ear, eyelids, hands, and feet form.  The genitals develop.  By the end of 8 weeks, all major organs are developing. Third month  All of the internal organs are forming.  Teeth develop below the gums.  Bones and muscles begin to grow. The spine can flex.  The skin is transparent.  Fingernails and toenails begin to form.  Arms and legs continue to grow longer, and hands and feet develop.  The fetus is about 3 in (7.6 cm) long. Fourth month  The placenta is completely formed.  The external sex organs, neck, outer ear, eyebrows, eyelids, and fingernails are formed.  The fetus can hear, swallow, and move its arms and legs.  The kidneys begin to produce urine.  The skin is covered with a white waxy coating (vernix) and very fine hair (lanugo). Fifth month  The fetus moves around more and can be felt for the first time (quickening).  The fetus starts to sleep and wake up and may begin to suck its finger.  The nails grow to the end of the fingers.  The organ in the digestive system that makes bile (gallbladder) functions and helps to digest the nutrients.  If your baby is a girl, eggs are present in her ovaries. If your baby is a boy, testicles start to move down into his scrotum. Sixth month  The lungs are formed, but the fetus is not yet able to breathe.  The eyes open. The brain continues to develop.  Your baby has fingerprints and toe prints. Your baby's hair grows thicker.  At the end of the second trimester, the fetus is about 9 in (22.9 cm) long. Seventh month  The fetus kicks and stretches.  The eyes are developed enough to sense changes in light.  The hands can make a grasping motion.  The fetus responds to sound. Eighth month  All organs and body  systems are fully developed and functioning.  Bones harden and taste buds develop. The fetus may hiccup.  Certain areas of the brain are still developing. The skull remains soft. Ninth month  The fetus gains about  lb (0.23 kg) each week.  The lungs are fully developed.  Patterns of sleep develop.  The fetus's head typically moves into a head-down position (vertex) in the uterus to prepare for birth. If the buttocks move into a vertex position instead, the baby is breech.  The fetus weighs 6-9 lbs (2.72-4.08 kg) and is 19-20 in (48.26-50.8 cm)  long. WHAT CAN I DO TO HAVE A HEALTHY PREGNANCY AND HELP MY BABY DEVELOP? Eating and Drinking  Eat a healthy diet.  Talk with your health care provider to make sure that you are getting the nutrients that you and your baby need.  Visit www.[employer].be to learn about creating a healthy diet.  Gain a healthy amount of weight during pregnancy as advised by your health care provider. This is usually 25-35 pounds. You may need to:  Gain more if you were underweight before getting pregnant or if you are pregnant with more than one baby.  Gain less if you were overweight or obese when you got pregnant. Medicines and Vitamins  Take prenatal vitamins as directed by your health care provider. These include vitamins such as folic acid, iron, calcium, and vitamin D. They are important for healthy development.  Take medicines only as directed by your health care provider. Read labels and ask a pharmacist or your health care provider whether over-the-counter medicines, supplements, and prescription drugs are safe to take during pregnancy. Activities  Be physically active as advised by your health care provider. Ask your health care provider to recommend activities that are safe for you to do, such as walking or swimming.  Do not participate in strenuous or extreme sports. Lifestyle  Do not drink alcohol.  Do not use any tobacco products,  including cigarettes, chewing tobacco, or electronic cigarettes. If you need help quitting, ask your health care provider.  Do not use illegal drugs. Safety  Avoid exposure to mercury, lead, or other heavy metals. Ask your health care provider about common sources of these heavy metals.  Avoid listeria infection during pregnancy. Follow these precautions:  Do not eat soft cheeses or deli meats.  Do not eat hot dogs unless they have been warmed up to the point of steaming, such as in the microwave oven.  Do not drink unpasteurized milk.  Avoid toxoplasmosis infection during pregnancy. Follow these precautions:  Do not change your cat's litter box, if you have a cat. Ask someone else to do this for you.  Wear gardening gloves while working in the yard. General Instructions  Keep all follow-up visits as directed by your health care provider. This is important. This includes prenatal care and screening tests.  Manage any chronic health conditions. Work closely with your health care provider to keep conditions, such as diabetes, under control. HOW DO I KNOW IF MY BABY IS DEVELOPING WELL? At each prenatal visit, your health care provider will do several different tests to check on your health and keep track of your baby's development. These include:  Fundal height.  Your health care provider will measure your growing belly from top to bottom using a tape measure.  Your health care provider will also feel your belly to determine your baby's position.  Heartbeat.  An ultrasound in the first trimester can confirm pregnancy and show a heartbeat, depending on how far along you are.  Your health care provider will check your baby's heart rate at every prenatal visit.  As you get closer to your delivery date, you may have regular fetal heart rate monitoring to make sure that your baby is not in distress.  Second trimester ultrasound.  This ultrasound checks your baby's development. It  also indicates your baby's gender. WHAT SHOULD I DO IF I HAVE CONCERNS ABOUT MY BABY'S DEVELOPMENT? Always talk with your health care provider about any concerns that you may have.   This information is not  intended to replace advice given to you by your health care provider. Make sure you discuss any questions you have with your health care provider.   Document Released: 07/25/2007 Document Revised: 10/27/2014 Document Reviewed: 07/15/2013 Elsevier Interactive Patient Education 2016 [employer]. First Trimester of Pregnancy The first trimester of pregnancy is from week 1 until the end of week 12 (months 1 through 3). A week after a sperm fertilizes an egg, the egg will implant on the wall of the uterus. This embryo will begin to develop into a baby. Genes from you and your partner are forming the baby. The female genes determine whether the baby is a boy or a girl. At 6-8 weeks, the eyes and face are formed, and the heartbeat can be seen on ultrasound. At the end of 12 weeks, all the baby's organs are formed.  Now that you are pregnant, you will want to do everything you can to have a healthy baby. Two of the most important things are to get good prenatal care and to follow your health care provider's instructions. Prenatal care is all the medical care you receive before the baby's birth. This care will help prevent, find, and treat any problems during the pregnancy and childbirth. BODY CHANGES Your body goes through many changes during pregnancy. The changes vary from woman to woman.   You may gain or lose a couple of pounds at first.  You may feel sick to your stomach (nauseous) and throw up (vomit). If the vomiting is uncontrollable, call your health care provider.  You may tire easily.  You may develop headaches that can be relieved by medicines approved by your health care provider.  You may urinate more often. Painful urination may mean you have a bladder infection.  You may develop  heartburn as a result of your pregnancy.  You may develop constipation because certain hormones are causing the muscles that push waste through your intestines to slow down.  You may develop hemorrhoids or swollen, bulging veins (varicose veins).  Your breasts may begin to grow larger and become tender. Your nipples may stick out more, and the tissue that surrounds them (areola) may become darker.  Your gums may bleed and may be sensitive to brushing and flossing.  Dark spots or blotches (chloasma, mask of pregnancy) may develop on your face. This will likely fade after the baby is born.  Your menstrual periods will stop.  You may have a loss of appetite.  You may develop cravings for certain kinds of food.  You may have changes in your emotions from day to day, such as being excited to be pregnant or being concerned that something may go wrong with the pregnancy and baby.  You may have more vivid and strange dreams.  You may have changes in your hair. These can include thickening of your hair, rapid growth, and changes in texture. Some women also have hair loss during or after pregnancy, or hair that feels dry or thin. Your hair will most likely return to normal after your baby is born. WHAT TO EXPECT AT YOUR PRENATAL VISITS During a routine prenatal visit:  You will be weighed to make sure you and the baby are growing normally.  Your blood pressure will be taken.  Your abdomen will be measured to track your baby's growth.  The fetal heartbeat will be listened to starting around week 10 or 12 of your pregnancy.  Test results from any previous visits will be discussed. Your health care  provider may ask you:  How you are feeling.  If you are feeling the baby move.  If you have had any abnormal symptoms, such as leaking fluid, bleeding, severe headaches, or abdominal cramping.  If you are using any tobacco products, including cigarettes, chewing tobacco, and electronic  cigarettes.  If you have any questions. Other tests that may be performed during your first trimester include:  Blood tests to find your blood type and to check for the presence of any previous infections. They will also be used to check for low iron levels (anemia) and Rh antibodies. Later in the pregnancy, blood tests for diabetes will be done along with other tests if problems develop.  Urine tests to check for infections, diabetes, or protein in the urine.  An ultrasound to confirm the proper growth and development of the baby.  An amniocentesis to check for possible genetic problems.  Fetal screens for spina bifida and Down syndrome.  You may need other tests to make sure you and the baby are doing well.  HIV (human immunodeficiency virus) testing. Routine prenatal testing includes screening for HIV, unless you choose not to have this test. HOME CARE INSTRUCTIONS  Medicines  Follow your health care provider's instructions regarding medicine use. Specific medicines may be either safe or unsafe to take during pregnancy.  Take your prenatal vitamins as directed.  If you develop constipation, try taking a stool softener if your health care provider approves. Diet  Eat regular, well-balanced meals. Choose a variety of foods, such as meat or vegetable-based protein, fish, milk and low-fat dairy products, vegetables, fruits, and whole grain breads and cereals. Your health care provider will help you determine the amount of weight gain that is right for you.  Avoid raw meat and uncooked cheese. These carry germs that can cause birth defects in the baby.  Eating four or five small meals rather than three large meals a day may help relieve nausea and vomiting. If you start to feel nauseous, eating a few soda crackers can be helpful. Drinking liquids between meals instead of during meals also seems to help nausea and vomiting.  If you develop constipation, eat more high-fiber foods, such  as fresh vegetables or fruit and whole grains. Drink enough fluids to keep your urine clear or pale yellow. Activity and Exercise  Exercise only as directed by your health care provider. Exercising will help you:  Control your weight.  Stay in shape.  Be prepared for labor and delivery.  Experiencing pain or cramping in the lower abdomen or low back is a good sign that you should stop exercising. Check with your health care provider before continuing normal exercises.  Try to avoid standing for long periods of time. Move your legs often if you must stand in one place for a long time.  Avoid heavy lifting.  Wear low-heeled shoes, and practice good posture.  You may continue to have sex unless your health care provider directs you otherwise. Relief of Pain or Discomfort  Wear a good support bra for breast tenderness.   Take warm sitz baths to soothe any pain or discomfort caused by hemorrhoids. Use hemorrhoid cream if your health care provider approves.   Rest with your legs elevated if you have leg cramps or low back pain.  If you develop varicose veins in your legs, wear support hose. Elevate your feet for 15 minutes, 3-4 times a day. Limit salt in your diet. Prenatal Care  Schedule your prenatal visits by  the twelfth week of pregnancy. They are usually scheduled monthly at first, then more often in the last 2 months before delivery.  Write down your questions. Take them to your prenatal visits.  Keep all your prenatal visits as directed by your health care provider. Safety  Wear your seat belt at all times when driving.  Make a list of emergency phone numbers, including numbers for family, friends, the hospital, and police and fire departments. General Tips  Ask your health care provider for a referral to a local prenatal education class. Begin classes no later than at the beginning of month 6 of your pregnancy.  Ask for help if you have counseling or nutritional needs  during pregnancy. Your health care provider can offer advice or refer you to specialists for help with various needs.  Do not use hot tubs, steam rooms, or saunas.  Do not douche or use tampons or scented sanitary pads.  Do not cross your legs for long periods of time.  Avoid cat litter boxes and soil used by cats. These carry germs that can cause birth defects in the baby and possibly loss of the fetus by miscarriage or stillbirth.  Avoid all smoking, herbs, alcohol, and medicines not prescribed by your health care provider. Chemicals in these affect the formation and growth of the baby.  Do not use any tobacco products, including cigarettes, chewing tobacco, and electronic cigarettes. If you need help quitting, ask your health care provider. You may receive counseling support and other resources to help you quit.  Schedule a dentist appointment. At home, brush your teeth with a soft toothbrush and be gentle when you floss. SEEK MEDICAL CARE IF:   You have dizziness.  You have mild pelvic cramps, pelvic pressure, or nagging pain in the abdominal area.  You have persistent nausea, vomiting, or diarrhea.  You have a bad smelling vaginal discharge.  You have pain with urination.  You notice increased swelling in your face, hands, legs, or ankles. SEEK IMMEDIATE MEDICAL CARE IF:   You have a fever.  You are leaking fluid from your vagina.  You have spotting or bleeding from your vagina.  You have severe abdominal cramping or pain.  You have rapid weight gain or loss.  You vomit blood or material that looks like coffee grounds.  You are exposed to [place] measles and have never had them.  You are exposed to fifth disease or chickenpox.  You develop a severe headache.  You have shortness of breath.  You have any kind of trauma, such as from a fall or a car accident.   This information is not intended to replace advice given to you by your health care provider. Make sure  you discuss any questions you have with your health care provider.   Document Released: 01/30/2001 Document Revised: 02/26/2014 Document Reviewed: 12/16/2012 Elsevier Interactive Patient Education 2016 [employer]. Commonly Asked Questions During Pregnancy  Cats: A parasite can be excreted in cat feces.  To avoid exposure you need to have another person empty the little box.  If you must empty the litter box you will need to wear gloves.  Wash your hands after handling your cat.  This parasite can also be found in raw or undercooked meat so this should also be avoided.  Colds, Sore Throats, Flu: Please check your medication sheet to see what you can take for symptoms.  If your symptoms are unrelieved by these medications please call the office.  Dental Work: Most  any dental work Agricultural consultant recommends is permitted.  X-rays should only be taken during the first trimester if absolutely necessary.  Your abdomen should be shielded with a lead apron during all x-rays.  Please notify your provider prior to receiving any x-rays.  Novocaine is fine; gas is not recommended.  If your dentist requires a note from [place] prior to dental work please call the office and we will provide one for you.  Exercise: Exercise is an important part of staying healthy during your pregnancy.  You may continue most exercises you were accustomed to prior to pregnancy.  Later in your pregnancy you will most likely notice you have difficulty with activities requiring balance like riding a bicycle.  It is important that you listen to your body and avoid activities that put you at a higher risk of falling.  Adequate rest and staying well hydrated are a must!  If you have questions about the safety of specific activities ask your provider.    Exposure to Children with illness: Try to avoid obvious exposure; report any symptoms to [place] when noted,  If you have chicken pos, red measles or mumps, you should be immune to these diseases.    Please do not take any vaccines while pregnant unless you have checked with your OB provider.  Fetal Movement: After 28 weeks we recommend you do "kick counts" twice daily.  Lie or sit down in a calm quiet environment and count your baby movements "kicks".  You should feel your baby at least 10 times per hour.  If you have not felt 10 kicks within the first hour get up, walk around and have something sweet to eat or drink then repeat for an additional hour.  If count remains less than 10 per hour notify your provider.  Fumigating: Follow your pest control agent's advice as to how long to stay out of your home.  Ventilate the area well before re-entering.  Hemorrhoids:   Most over-the-counter preparations can be used during pregnancy.  Check your medication to see what is safe to use.  It is important to use a stool softener or fiber in your diet and to drink lots of liquids.  If hemorrhoids seem to be getting worse please call the office.   Hot Tubs:  Hot tubs Jacuzzis and saunas are not recommended while pregnant.  These increase your internal body temperature and should be avoided.  Intercourse:  Sexual intercourse is safe during pregnancy as long as you are comfortable, unless otherwise advised by your provider.  Spotting may occur after intercourse; report any bright red bleeding that is heavier than spotting.  Labor:  If you know that you are in labor, please go to the hospital.  If you are unsure, please call the office and let us help you decide what to do.  Lifting, straining, etc:  If your job requires heavy lifting or straining please check with your provider for any limitations.  Generally, you should not lift items heavier than that you can lift simply with your hands and arms (no back muscles)  Painting:  Paint fumes do not harm your pregnancy, but may make you ill and should be avoided if possible.  Latex or water based paints have less odor than oils.  Use adequate ventilation while  painting.  Permanents & Hair Color:  Chemicals in hair dyes are not recommended as they cause increase hair dryness which can increase hair loss during pregnancy.  " Highlighting" and permanents are  allowed.  Dye may be absorbed differently and permanents may not hold as well during pregnancy.  Sunbathing:  Use a sunscreen, as skin burns easily during pregnancy.  Drink plenty of fluids; avoid over heating.  Tanning Beds:  Because their possible side effects are still unknown, tanning beds are not recommended.  Ultrasound Scans:  Routine ultrasounds are performed at approximately 20 weeks.  You will be able to see your baby's general anatomy an if you would like to know the gender this can usually be determined as well.  If it is questionable when you conceived you may also receive an ultrasound early in your pregnancy for dating purposes.  Otherwise ultrasound exams are not routinely performed unless there is a medical necessity.  Although you can request a scan we ask that you pay for it when conducted because insurance does not cover " patient request" scans.  Work: If your pregnancy proceeds without complications you may work until your due date, unless your physician or employer advises otherwise.  Round Ligament Pain/Pelvic Discomfort:  Sharp, shooting pains not associated with bleeding are fairly common, usually occurring in the second trimester of pregnancy.  They tend to be worse when standing up or when you remain standing for long periods of time.  These are the result of pressure of certain pelvic ligaments called "round ligaments".  Rest, Tylenol and heat seem to be the most effective relief.  As the womb and fetus grow, they rise out of the pelvis and the discomfort improves.  Please notify the office if your pain seems different than that described.  It may represent a more serious condition.

## 2015-07-08 NOTE — Progress Notes (Signed)
Kathryn Johnston presents for NOB nurse interview visit. G3 -. P 2002. Pt seen in ER on 4/25. iup at 8 4/7. Pt has h/o dvt in 1st pregnancy. S/p Lovenox therapy. Advised with 2nd pregnancy to take Lovenox but pt states she only took it for the 1st six weeks. H/o of tobacco use.  Pregnancy education material explained and given. _0 cats in the home. NOB labs ordered.  HIV labs and Drug screen were explained  ordered. PNV encouraged. Concept dha erx.  NT to discuss with provider and provider to order.  Pt. To follow up with provider in _1_ week for NOB physical. Ad[facility] as she is high risk. D/t AC on vacation will let mad do nob PE.  All questions answered.

## 2015-07-09 LAB — MONITOR DRUG PROFILE 14(MW)
AMPHETAMINE SCREEN URINE: NEGATIVE ng/mL
BARBITURATE SCREEN URINE: NEGATIVE ng/mL
BENZODIAZEPINE SCREEN, URINE: NEGATIVE ng/mL
Buprenorphine, Urine: NEGATIVE ng/mL
CANNABINOIDS UR QL SCN: NEGATIVE ng/mL
COCAINE(METAB.)SCREEN, URINE: NEGATIVE ng/mL
Creatinine(Crt), U: 135.4 mg/dL (ref 20.0–300.0)
FENTANYL, URINE: NEGATIVE pg/mL
MEPERIDINE SCREEN, URINE: NEGATIVE ng/mL
Methadone Screen, Urine: NEGATIVE ng/mL
OXYCODONE+OXYMORPHONE UR QL SCN: NEGATIVE ng/mL
Opiate Scrn, Ur: NEGATIVE ng/mL
PH UR, DRUG SCRN: 7.5 (ref 4.5–8.9)
PROPOXYPHENE SCREEN URINE: NEGATIVE ng/mL
Phencyclidine Qn, Ur: NEGATIVE ng/mL
SPECIFIC GRAVITY: 1.03
Tramadol Screen, Urine: NEGATIVE ng/mL

## 2015-07-09 LAB — URINALYSIS, ROUTINE W REFLEX MICROSCOPIC
Bilirubin, UA: NEGATIVE
GLUCOSE, UA: NEGATIVE
KETONES UA: NEGATIVE
LEUKOCYTES UA: NEGATIVE
Nitrite, UA: NEGATIVE
Protein, UA: NEGATIVE
RBC, UA: NEGATIVE
SPEC GRAV UA: 1.023 (ref 1.005–1.030)
Urobilinogen, Ur: 0.2 mg/dL (ref 0.2–1.0)
pH, UA: 7.5 (ref 5.0–7.5)

## 2015-07-09 LAB — CBC WITH DIFFERENTIAL/PLATELET
BASOS: 0 %
Basophils Absolute: 0 10*3/uL (ref 0.0–0.2)
EOS (ABSOLUTE): 0 10*3/uL (ref 0.0–0.4)
EOS: 1 %
HEMATOCRIT: 30.5 % — AB (ref 34.0–46.6)
HEMOGLOBIN: 9.9 g/dL — AB (ref 11.1–15.9)
IMMATURE GRANS (ABS): 0 10*3/uL (ref 0.0–0.1)
IMMATURE GRANULOCYTES: 0 %
LYMPHS: 38 %
Lymphocytes Absolute: 2.2 10*3/uL (ref 0.7–3.1)
MCH: 24 pg — ABNORMAL LOW (ref 26.6–33.0)
MCHC: 32.5 g/dL (ref 31.5–35.7)
MCV: 74 fL — AB (ref 79–97)
MONOCYTES: 5 %
Monocytes Absolute: 0.3 10*3/uL (ref 0.1–0.9)
NEUTROS ABS: 3.2 10*3/uL (ref 1.4–7.0)
NEUTROS PCT: 56 %
PLATELETS: 277 10*3/uL (ref 150–379)
RBC: 4.13 x10E6/uL (ref 3.77–5.28)
RDW: 13.2 % (ref 12.3–15.4)
WBC: 5.8 10*3/uL (ref 3.4–10.8)

## 2015-07-09 LAB — ABO AND RH: Rh Factor: POSITIVE

## 2015-07-09 LAB — NICOTINE SCREEN, URINE: Cotinine Ql Scrn, Ur: POSITIVE ng/mL

## 2015-07-09 LAB — RUBELLA SCREEN: Rubella Antibodies, IGG: 8.03 index (ref >0.99)

## 2015-07-09 LAB — RPR: RPR: NONREACTIVE

## 2015-07-09 LAB — HEPATITIS B SURFACE ANTIGEN: Hepatitis B Surface Ag: NEGATIVE

## 2015-07-09 LAB — HIV ANTIBODY (ROUTINE TESTING W REFLEX): HIV Screen 4th Generation wRfx: NONREACTIVE

## 2015-07-09 LAB — ANTIBODY SCREEN: Antibody Screen: NEGATIVE

## 2015-07-09 LAB — SICKLE CELL SCREEN: SICKLE CELL SCREEN: NEGATIVE

## 2015-07-09 LAB — VARICELLA ZOSTER ANTIBODY, IGG: Varicella zoster IgG: 139 index — ABNORMAL LOW (ref >165)

## 2015-07-10 LAB — CULTURE, OB URINE

## 2015-07-10 LAB — URINE CULTURE, OB REFLEX

## 2015-07-11 ENCOUNTER — Telehealth: Payer: Self-pay

## 2015-07-11 DIAGNOSIS — D509 Iron deficiency anemia, unspecified: Secondary | ICD-10-CM

## 2015-07-11 DIAGNOSIS — O99012 Anemia complicating pregnancy, second trimester: Principal | ICD-10-CM

## 2015-07-11 MED ORDER — FERROUS SULFATE 325 (65 FE) MG PO TABS: 325.0000 mg | ORAL_TABLET | Freq: Two times a day (BID) | ORAL | Status: DC

## 2015-07-11 NOTE — Telephone Encounter (Signed)
-----   Message from Hildred LaserAnika Cherry, MD sent at 07/11/2015  8:47 AM EDT ----- NOB labs reviewed, please inform patient that she is anemic, needs additional iron supplement (2 times daily)

## 2015-07-11 NOTE — Telephone Encounter (Signed)
Called pt, phone states that she is not accepting incoming calls. RX sent in for iron.

## 2015-07-12 ENCOUNTER — Encounter: Payer: Medicaid Other | Admitting: Obstetrics and Gynecology

## 2015-07-12 LAB — GC/CHLAMYDIA PROBE AMP
Chlamydia trachomatis, NAA: NEGATIVE
NEISSERIA GONORRHOEAE BY PCR: NEGATIVE

## 2015-07-25 ENCOUNTER — Encounter: Payer: Self-pay | Admitting: Emergency Medicine

## 2015-07-25 ENCOUNTER — Emergency Department
Admission: EM | Admit: 2015-07-25 | Discharge: 2015-07-25 | Disposition: A | Discharge status: Home / Self Care | Payer: Medicaid Other | Attending: Emergency Medicine | Admitting: Emergency Medicine

## 2015-07-25 DIAGNOSIS — O26899 Other specified pregnancy related conditions, unspecified trimester: Secondary | ICD-10-CM

## 2015-07-25 DIAGNOSIS — O26891 Other specified pregnancy related conditions, first trimester: Secondary | ICD-10-CM | POA: Diagnosis not present

## 2015-07-25 DIAGNOSIS — R1030 Lower abdominal pain, unspecified: Secondary | ICD-10-CM | POA: Diagnosis not present

## 2015-07-25 DIAGNOSIS — Z87891 Personal history of nicotine dependence: Secondary | ICD-10-CM | POA: Diagnosis not present

## 2015-07-25 DIAGNOSIS — Z3A14 14 weeks gestation of pregnancy: Secondary | ICD-10-CM | POA: Diagnosis not present

## 2015-07-25 DIAGNOSIS — M545 Low back pain: Secondary | ICD-10-CM | POA: Diagnosis present

## 2015-07-25 DIAGNOSIS — J45909 Unspecified asthma, uncomplicated: Secondary | ICD-10-CM | POA: Diagnosis not present

## 2015-07-25 DIAGNOSIS — R109 Unspecified abdominal pain: Secondary | ICD-10-CM

## 2015-07-25 LAB — BASIC METABOLIC PANEL
ANION GAP: 8 (ref 5–15)
BUN: 7 mg/dL (ref 6–20)
CHLORIDE: 104 mmol/L (ref 101–111)
CO2: 23 mmol/L (ref 22–32)
Calcium: 9 mg/dL (ref 8.9–10.3)
Creatinine, Ser: 0.62 mg/dL (ref 0.44–1.00)
GFR calc Af Amer: >60 mL/min (ref >60)
GFR calc non Af Amer: >60 mL/min (ref >60)
GLUCOSE: 121 mg/dL — AB (ref 65–99)
POTASSIUM: 3.4 mmol/L — AB (ref 3.5–5.1)
Sodium: 135 mmol/L (ref 135–145)

## 2015-07-25 LAB — URINALYSIS COMPLETE WITH MICROSCOPIC (ARMC ONLY)
BILIRUBIN URINE: NEGATIVE
GLUCOSE, UA: NEGATIVE mg/dL
HGB URINE DIPSTICK: NEGATIVE
KETONES UR: NEGATIVE mg/dL
LEUKOCYTES UA: NEGATIVE
NITRITE: NEGATIVE
PH: 7 (ref 5.0–8.0)
Protein, ur: NEGATIVE mg/dL
RBC / HPF: NONE SEEN RBC/hpf (ref 0–5)
SPECIFIC GRAVITY, URINE: 1.005 (ref 1.005–1.030)

## 2015-07-25 LAB — CBC
HCT: 31.7 % — ABNORMAL LOW (ref 35.0–47.0)
Hemoglobin: 9.9 g/dL — ABNORMAL LOW (ref 12.0–16.0)
MCH: 23.7 pg — AB (ref 26.0–34.0)
MCHC: 31.2 g/dL — AB (ref 32.0–36.0)
MCV: 76.1 fL — AB (ref 80.0–100.0)
Platelets: 237 10*3/uL (ref 150–440)
RBC: 4.17 MIL/uL (ref 3.80–5.20)
RDW: 13 % (ref 11.5–14.5)
WBC: 5.5 10*3/uL (ref 3.6–11.0)

## 2015-07-25 LAB — HCG, QUANTITATIVE, PREGNANCY: hCG, Beta Chain, Quant, S: 57768 m[IU]/mL — ABNORMAL HIGH (ref <5)

## 2015-07-25 NOTE — ED Provider Notes (Addendum)
[facility] Emergency Department Provider Note  Time seen: 9:23 PM  I have reviewed the triage vital signs and the nursing notes.   HISTORY  Chief Complaint Abdominal Pain    HPI Kathryn Johnston is a 20 y.o. female G3 P2 approximately [redacted] weeks pregnant who presents to the emergency department with lower abdominal discomfort and painful urination. According to the patient she has noted some mild lower abdominal/lower back discomfort with cloudy urine and slight dysuria. Symptoms have been ongoing for the past 2-3 days. Patient states a history of urinary tract infections in the past which this feels similar. Denies any nausea, vomiting, diarrhea, fever, vaginal bleeding or discharge.     Past Medical History  Diagnosis Date  . DVT (deep venous thrombosis) (HCC)     high- risk in 1st pregnancy- on lovenox both pregnancy- 2nd pregnancy only used lovenox for 6 weeks- pt did not like it  . Asthma   . Anemia     There are no active problems to display for this patient.   Past Surgical History  Procedure Laterality Date  . No past surgeries      Current Outpatient Rx  Name  Route  Sig  Dispense  Refill  . ferrous sulfate 325 (65 FE) MG tablet   Oral   Take 1 tablet (325 mg total) by mouth 2 (two) times daily with a meal.   60 tablet   3   . Prenat-FeFum-FePo-FA-Omega 3 (CONCEPT DHA) 53.5-38-1 MG CAPS   Oral   Take 53.5 mg by mouth daily.   90 capsule   4     Allergies Ibuprofen  Family History  Problem Relation Age of Onset  . Diabetes Mother   . Diabetes Maternal Grandmother   . Cancer Neg Hx   . Heart disease Neg Hx     Social History Social History  Substance Use Topics  . Smoking status: Former Smoker -- 0.50 packs/day    Types: Cigarettes    Quit date: 04/20/2015  . Smokeless tobacco: Never Used  . Alcohol Use: No    Review of Systems Constitutional: Negative for fever. Cardiovascular: Negative for chest pain. Respiratory:  Negative for shortness of breath. Gastrointestinal: Lower abdominal pain. Genitourinary: Slight dysuria. Musculoskeletal: Mild lower back pain. Neurological: Negative for headache 10-point ROS otherwise negative.  ____________________________________________   PHYSICAL EXAM:  VITAL SIGNS: ED Triage Vitals  Enc Vitals Group     BP 07/25/15 2028 104/70 mmHg     Pulse Rate 07/25/15 2028 100     Resp 07/25/15 2028 20     Temp 07/25/15 2028 98.3 F (36.8 C)     Temp Source 07/25/15 2028 Oral     SpO2 07/25/15 2028 100 %     Weight 07/25/15 2028 120 lb (54.432 kg)     Height 07/25/15 2028  (1.575 m)     Head Cir --      Peak Flow --      Pain Score 07/25/15 2027 10     Pain Loc --      Pain Edu? --      Excl. in GC? --     Constitutional: Alert and oriented. Well appearing and in no distress. Eyes: Normal exam ENT   Head: Normocephalic and atraumatic   Mouth/Throat: Mucous membranes are moist. Cardiovascular: Normal rate, regular rhythm. No murmur Respiratory: Normal respiratory effort without tachypnea nor retractions. Breath sounds are clear  Gastrointestinal: Soft, slight suprapubic tenderness palpation. No rebound  or guarding. Musculoskeletal: Nontender with normal range of motion in all extremities. Neurologic:  Normal speech and language. No gross focal neurologic deficits  Skin:  Skin is warm, dry and intact.  Psychiatric: Mood and affect are normal.   ____________________________________________   INITIAL IMPRESSION / ASSESSMENT AND PLAN / ED COURSE  Pertinent labs & imaging results that were available during my care of the patient were reviewed by me and considered in my medical decision making (see chart for details).  Overall very well-appearing patient, no distress, very minimal tenderness to palpation across the lower abdomen. Labs are within normal limits. Urinalysis negative for urinary tract infection. Bedside ultrasound shows a fetal heart  rate of 158 bpm, with good fetal movement no abnormalities identified. Overall the patient appears very well, she will follow-up with her OB/GYN. I discussed with patient calling tomorrow to arrange a follow-up appointment. Patient is agreeable.  ____________________________________________   FINAL CLINICAL IMPRESSION(S) / ED DIAGNOSES  Abdominal pain and pregnancy   Minna AntisKevin Kanye Depree, MD 07/25/15 2125  Minna AntisKevin Estanislado Surgeon, MD 07/25/15 2125

## 2015-07-25 NOTE — Discharge Instructions (Signed)
Please follow-up with your OB/GYN by calling tomorrow for the next available appointment. Return to the emergency department for any worsening abdominal pain.   Abdominal Pain, Adult Many things can cause belly (abdominal) pain. Most times, the belly pain is not dangerous. Many cases of belly pain can be watched and treated at home. HOME CARE   Do not take medicines that help you go poop (laxatives) unless told to by your doctor.  Only take medicine as told by your doctor.  Eat or drink as told by your doctor. Your doctor will tell you if you should be on a special diet. GET HELP IF:  You do not know what is causing your belly pain.  You have belly pain while you are sick to your stomach (nauseous) or have runny poop (diarrhea).  You have pain while you pee or poop.  Your belly pain wakes you up at night.  You have belly pain that gets worse or better when you eat.  You have belly pain that gets worse when you eat fatty foods.  You have a fever. GET HELP RIGHT AWAY IF:   The pain does not go away within 2 hours.  You keep throwing up (vomiting).  The pain changes and is only in the right or left part of the belly.  You have bloody or tarry looking poop. MAKE SURE YOU:   Understand these instructions.  Will watch your condition.  Will get help right away if you are not doing well or get worse.   This information is not intended to replace advice given to you by your health care provider. Make sure you discuss any questions you have with your health care provider.   Document Released: 07/25/2007 Document Revised: 02/26/2014 Document Reviewed: 10/15/2012 Elsevier Interactive Patient Education [employer]2016 Elsevier Inc.

## 2015-07-25 NOTE — ED Notes (Addendum)
Patient ambulatory to triage with steady gait, without difficulty or distress noted; pt reports lower abd/back pain with fever and cloudy urine; st hx UTI; approx [redacted]wks pregnant; G3P2; pt unsure of her OB physician

## 2015-08-01 NOTE — Progress Notes (Signed)
I have reviewed the record and concur with patient management and plan as documented in NOB intake by Darol Destinerystal Miller, CMA.  Hildred LaserAnika Ulyess Muto, MD [facility]

## 2015-08-09 ENCOUNTER — Encounter: Payer: Self-pay | Admitting: Obstetrics and Gynecology

## 2015-08-09 ENCOUNTER — Ambulatory Visit (INDEPENDENT_AMBULATORY_CARE_PROVIDER_SITE_OTHER): Payer: Medicaid Other | Admitting: Obstetrics and Gynecology

## 2015-08-09 ENCOUNTER — Other Ambulatory Visit: Payer: Self-pay | Admitting: Obstetrics and Gynecology

## 2015-08-09 VITALS — BP 94/60 | HR 93 | Wt 122.5 lb

## 2015-08-09 DIAGNOSIS — O09292 Supervision of pregnancy with other poor reproductive or obstetric history, second trimester: Secondary | ICD-10-CM

## 2015-08-09 DIAGNOSIS — Z87440 Personal history of urinary (tract) infections: Secondary | ICD-10-CM

## 2015-08-09 DIAGNOSIS — Z124 Encounter for screening for malignant neoplasm of cervix: Secondary | ICD-10-CM

## 2015-08-09 DIAGNOSIS — O0931 Supervision of pregnancy with insufficient antenatal care, first trimester: Secondary | ICD-10-CM

## 2015-08-09 DIAGNOSIS — Z86718 Personal history of other venous thrombosis and embolism: Secondary | ICD-10-CM

## 2015-08-09 DIAGNOSIS — J4520 Mild intermittent asthma, uncomplicated: Secondary | ICD-10-CM

## 2015-08-09 DIAGNOSIS — N10 Acute tubulo-interstitial nephritis: Secondary | ICD-10-CM

## 2015-08-09 DIAGNOSIS — O0992 Supervision of high risk pregnancy, unspecified, second trimester: Secondary | ICD-10-CM

## 2015-08-09 DIAGNOSIS — Z8759 Personal history of other complications of pregnancy, childbirth and the puerperium: Secondary | ICD-10-CM

## 2015-08-09 DIAGNOSIS — O09892 Supervision of other high risk pregnancies, second trimester: Secondary | ICD-10-CM

## 2015-08-09 LAB — POCT URINALYSIS DIPSTICK
BILIRUBIN UA: NEGATIVE
Blood, UA: NEGATIVE
Glucose, UA: NEGATIVE
KETONES UA: NEGATIVE
Leukocytes, UA: NEGATIVE
Nitrite, UA: NEGATIVE
PROTEIN UA: NEGATIVE
SPEC GRAV UA: 1.025
Urobilinogen, UA: NEGATIVE
pH, UA: 6

## 2015-08-09 NOTE — Progress Notes (Signed)
OBSTETRIC INITIAL PRENATAL VISIT  Subjective:    Kathryn Johnston is being seen today for her first obstetrical visit.  This is not a planned pregnancy. She is a [id number] female at 5177w2d gestation, Estimated Date of Delivery: 01/21/16 with Patient's last menstrual period was 04/16/2015 (exact), (consistent with 8 week ER sono). Her obstetrical history is significant for history of IUGR in 2nd pregnancy, h/o DVT in 1st pregnancy, history of recurrent UTI's in pregnancy. Relationship with FOB: significant other, not living together. Patient does not intend to breast feed. Pregnancy history fully reviewed.    Obstetric History   G4   P2   T2   P0   A1   TAB0   SAB1   E0   M0   L2     # Outcome Date GA Lbr Len/2nd Weight Sex Delivery Anes PTL Lv  4 Current           3 Term 06/28/14 8673w0d  5 lb 14.4 oz (2.676 kg) M Vag-Spont  N Y     Complications: IUGR (intrauterine growth restriction)  2 SAB 04/2013 626w0d   U    FD  1 Term 07/26/11 6818w0d  6 lb 6 oz (2.892 kg) M Vag-Spont  N Y     Complications: DVT (deep vein thrombosis) in pregnancy      Gynecologic History:  Last pap smear was: No prior pap history.  Denies history of STIs, however review of chart notes h/o trichomoniasis in 2013   Past Medical History  Diagnosis Date  . DVT (deep venous thrombosis) (HCC)     high- risk in 1st pregnancy- on lovenox both pregnancy- 2nd pregnancy only used lovenox for 6 weeks- pt did not like it  . Asthma   . Anemia   . H/O trichomoniasis     2013  . Pyelonephritis     10[facility]2014, [facility]    Family History  Problem Relation Age of Onset  . Diabetes Mother   . Diabetes Maternal Grandmother   . Cancer Neg Hx   . Heart disease Neg Hx     Past Surgical History  Procedure Laterality Date  . No past surgeries      Social History   Social History  . Marital Status: Single    Spouse Name: N/A  . Number of Children: N/A  . Years of Education: N/A   Occupational  History  . Not on file.   Social History Main Topics  . Smoking status: Former Smoker -- 0.50 packs/day    Types: Cigarettes    Quit date: 04/20/2015  . Smokeless tobacco: Never Used  . Alcohol Use: No  . Drug Use: No  . Sexual Activity: Yes    Birth Control/ Protection: None   Other Topics Concern  . Not on file   Social History Narrative    Current Outpatient Prescriptions on File Prior to Visit  Medication Sig Dispense Refill  . ferrous sulfate 325 (65 FE) MG tablet Take 1 tablet (325 mg total) by mouth 2 (two) times daily with a meal. 60 tablet 3  . Prenat-FeFum-FePo-FA-Omega 3 (CONCEPT DHA) 53.5-38-1 MG CAPS Take 53.5 mg by mouth daily. 90 capsule 4   No current facility-administered medications on file prior to visit.    Allergies  Allergen Reactions  . Ibuprofen Swelling     Review of Systems General:Not Present- Fever, Weight Loss and Weight Gain. Skin:Not Present- Rash. HEENT:Not Present- Blurred Vision, Headache and Bleeding Gums. Respiratory:Not  Present- Difficulty Breathing. Breast:Not Present- Breast Mass. Cardiovascular:Not Present- Chest Pain, Elevated Blood Pressure, Fainting / Blacking Out and Shortness of Breath. Gastrointestinal:Not Present- Abdominal Pain, Constipation, Nausea and Vomiting. Female Genitourinary:Not Present- Frequency, Painful Urination, Pelvic Pain, Vaginal Bleeding, Vaginal Discharge, Contractions, regular, Fetal Movements Decreased, Urinary Complaints and Vaginal Fluid. Musculoskeletal:Not Present- Back Pain and Leg Cramps. Neurological:Not Present- Dizziness. Psychiatric:Not Present- Depression.     Objective:   Blood pressure 94/60, pulse 93, weight 122 lb 8 oz (55.566 kg), last menstrual period 04/16/2015.  Body mass index is 22.4 kg/(m^2).  General Appearance:    Alert, cooperative, no distress, appears stated age  Head:    Normocephalic, without obvious abnormality, atraumatic  Eyes:    PERRL,  conjunctiva/corneas clear, EOM's intact, both eyes  Ears:    Normal external ear canals, both ears  Nose:   Nares normal, septum midline, mucosa normal, no drainage or sinus tenderness  Throat:   Lips, mucosa, and tongue normal; teeth and gums normal  Neck:   Supple, symmetrical, trachea midline, no adenopathy; thyroid: no enlargement/tenderness/nodules; no carotid bruit or JVD  Back:     Symmetric, no curvature, ROM normal, no CVA tenderness  Lungs:     Clear to auscultation bilaterally, respirations unlabored  Chest Wall:    No tenderness or deformity   Heart:    Regular rate and rhythm, S1 and S2 normal, no murmur, rub or gallop  Breast Exam:    No tenderness, masses, or nipple abnormality  Abdomen:     Soft, non-tender, bowel sounds active all four quadrants, no masses, no organomegaly.  FH 16.  FHT 150 bpm.  Genitalia:    Pelvic:external genitalia normal, vagina without lesions, discharge, or tenderness, rectovaginal septum  normal. Cervix normal in appearance, no cervical motion tenderness, no adnexal masses or tenderness.  Pregnancy positive findings: uterine enlargement: 16 wk size, nontender.  Rectal:    Normal external sphincter.  No hemorrhoids appreciated. Internal exam not done.   Extremities:   Extremities normal, atraumatic, no cyanosis or edema  Pulses:   2+ and symmetric all extremities  Skin:   Skin color, texture, turgor normal, no rashes or lesions  Lymph nodes:   Cervical, supraclavicular, and axillary nodes normal  Neurologic:   CNII-XII intact, normal strength, sensation and reflexes throughout     Assessment:    Pregnancy at 16 and 2/7 weeks   H/o DVT in pregnancy Insufficient prenatal care in 1st trimester (missed last appointment) Short interval between pregnancies (>12 months) H/o asthma H/o recurrent UTI's (with pyelonephritis last year) H/o IUGR in 2nd pregnancy   Plan:    Initial labs reviewed. Prenatal vitamins encouraged. Problem list reviewed and  updated. New OB counseling:  The patient has been given an overview regarding routine prenatal care.  Recommendations regarding diet, weight gain, and exercise in pregnancy were given. Prenatal testing, optional genetic testing, and ultrasound use in pregnancy were reviewed.  AFP4 discussed: ordered. Benefits of Breast Feeding were discussed. The patient is encouraged to consider nursing her baby post partum. Advised that patient would need to begin DVT prophylaxis treatment during current pregnancy, likely with Lovenox.  Patient declines use of Lovenox this pregnancy stating that "those shots hurt, and I didn't really take them much in my second pregnancy and I was fine". Discussed alternative sites for injection, alternative medication (i.e. Heparin), risks of DVT and PE, especially in pregnancy, however patient continues to decline.  Will also send for referral to [facility] for high risk pregnancy  status.  H/o asthma, patient does not require use of inhaler, has not had an asthma attack in several years.  Discussed that she can have exacerbations in pregnancy, and to alert MD of progressive symptoms.  Patient notes that she is probably relocating back to [facility] soon, and will likely be transferring her care.  Advised patient to sign medical records release so that she can have her records sent to whatever practice she chooses in [place].  H/o recurrent UTI's, will need monthly urine cultures.  Would start daily prophylaxis with Macrobid after first UTI in pregnancy.  H/o IUGR in 2nd pregnancy, delivered at 37 weeks, will follow growth closely this pregnancy Follow up in 4 weeks if care has not been transferred.  For anatomy scan in 2-3 weeks.  50% of 45 min visit spent on counseling and coordination of care.     Hildred Laser, MD [facility]

## 2015-08-10 ENCOUNTER — Encounter: Payer: Self-pay | Admitting: Obstetrics and Gynecology

## 2015-08-10 DIAGNOSIS — O09299 Supervision of pregnancy with other poor reproductive or obstetric history, unspecified trimester: Secondary | ICD-10-CM | POA: Insufficient documentation

## 2015-08-10 DIAGNOSIS — O0931 Supervision of pregnancy with insufficient antenatal care, first trimester: Secondary | ICD-10-CM | POA: Insufficient documentation

## 2015-08-10 DIAGNOSIS — J45909 Unspecified asthma, uncomplicated: Secondary | ICD-10-CM | POA: Insufficient documentation

## 2015-08-10 DIAGNOSIS — O09892 Supervision of other high risk pregnancies, second trimester: Secondary | ICD-10-CM | POA: Insufficient documentation

## 2015-08-12 LAB — AFP, QUAD SCREEN
DIA MOM VALUE: 0.77
DIA VALUE (EIA): 155.97 pg/mL
DSR (By Age)    1 IN: 1143
DSR (SECOND TRIMESTER) 1 IN: 10000
GESTATIONAL AGE AFP: 16.4 wk
MSAFP Mom: 0.59
MSAFP: 25.6 ng/mL
MSHCG Mom: 0.5
MSHCG: 21546 m[IU]/mL
Maternal Age At EDD: 21.3 YEARS
Osb Risk: 10000
PDF: 0
Test Results:: NEGATIVE
UE3 MOM: 0.77
Weight: 123 [lb_av]
uE3 Value: 0.75 ng/mL

## 2015-08-15 LAB — PAP IG, CT-NG, RFX HPV ASCU
CHLAMYDIA, NUC. ACID AMP: NEGATIVE
Gonococcus by Nucleic Acid Amp: NEGATIVE
PAP SMEAR COMMENT: 0

## 2015-08-18 ENCOUNTER — Ambulatory Visit: Payer: Medicaid Other

## 2015-08-18 ENCOUNTER — Institutional Professional Consult (permissible substitution): Payer: Medicaid Other

## 2015-08-19 ENCOUNTER — Telehealth: Payer: Self-pay

## 2015-08-19 NOTE — Telephone Encounter (Signed)
Called pt, her phone is currently not accepting incoming calls, will have Deb try to call pt again on Monday

## 2015-08-19 NOTE — Telephone Encounter (Signed)
-----   Message from Hildred LaserAnika Cherry, MD sent at 08/19/2015 10:55 AM EDT ----- Please inform of normal 2nd trimester screen

## 2015-08-26 NOTE — Telephone Encounter (Signed)
Phone # continues to be unavailable.

## 2015-08-29 NOTE — Telephone Encounter (Signed)
Will inform pt of negative results at her next visit as we have been unable to contact her by phone.

## 2015-09-22 DIAGNOSIS — O99019 Anemia complicating pregnancy, unspecified trimester: Secondary | ICD-10-CM | POA: Insufficient documentation

## 2016-05-06 ENCOUNTER — Emergency Department
Admission: EM | Admit: 2016-05-06 | Discharge: 2016-05-06 | Disposition: A | Discharge status: Home / Self Care | Payer: Medicaid Other | Attending: Emergency Medicine | Admitting: Emergency Medicine

## 2016-05-06 ENCOUNTER — Encounter: Payer: Self-pay | Admitting: Emergency Medicine

## 2016-05-06 ENCOUNTER — Emergency Department: Payer: Medicaid Other

## 2016-05-06 DIAGNOSIS — J45909 Unspecified asthma, uncomplicated: Secondary | ICD-10-CM | POA: Diagnosis not present

## 2016-05-06 DIAGNOSIS — Z87891 Personal history of nicotine dependence: Secondary | ICD-10-CM | POA: Insufficient documentation

## 2016-05-06 DIAGNOSIS — N12 Tubulo-interstitial nephritis, not specified as acute or chronic: Secondary | ICD-10-CM | POA: Insufficient documentation

## 2016-05-06 DIAGNOSIS — R109 Unspecified abdominal pain: Secondary | ICD-10-CM | POA: Diagnosis not present

## 2016-05-06 DIAGNOSIS — Z79899 Other long term (current) drug therapy: Secondary | ICD-10-CM | POA: Insufficient documentation

## 2016-05-06 DIAGNOSIS — R350 Frequency of micturition: Secondary | ICD-10-CM | POA: Diagnosis present

## 2016-05-06 LAB — URINALYSIS, ROUTINE W REFLEX MICROSCOPIC
Bilirubin Urine: NEGATIVE
GLUCOSE, UA: NEGATIVE mg/dL
KETONES UR: NEGATIVE mg/dL
Nitrite: NEGATIVE
PH: 8 (ref 5.0–8.0)
Protein, ur: 30 mg/dL — AB
Specific Gravity, Urine: 1.012 (ref 1.005–1.030)

## 2016-05-06 LAB — CBC WITH DIFFERENTIAL/PLATELET
BASOS PCT: 0 %
Basophils Absolute: 0 10*3/uL (ref 0–0.1)
EOS ABS: 0 10*3/uL (ref 0–0.7)
EOS PCT: 0 %
HCT: 34.6 % — ABNORMAL LOW (ref 35.0–47.0)
Hemoglobin: 11.2 g/dL — ABNORMAL LOW (ref 12.0–16.0)
Lymphocytes Relative: 10 %
Lymphs Abs: 0.8 10*3/uL — ABNORMAL LOW (ref 1.0–3.6)
MCH: 24.7 pg — ABNORMAL LOW (ref 26.0–34.0)
MCHC: 32.5 g/dL (ref 32.0–36.0)
MCV: 76 fL — ABNORMAL LOW (ref 80.0–100.0)
MONO ABS: 0.4 10*3/uL (ref 0.2–0.9)
MONOS PCT: 5 %
Neutro Abs: 6.7 10*3/uL — ABNORMAL HIGH (ref 1.4–6.5)
Neutrophils Relative %: 85 %
PLATELETS: 261 10*3/uL (ref 150–440)
RBC: 4.55 MIL/uL (ref 3.80–5.20)
RDW: 13.1 % (ref 11.5–14.5)
WBC: 8 10*3/uL (ref 3.6–11.0)

## 2016-05-06 LAB — BASIC METABOLIC PANEL
Anion gap: 5 (ref 5–15)
BUN: 20 mg/dL (ref 6–20)
CO2: 27 mmol/L (ref 22–32)
CREATININE: 0.88 mg/dL (ref 0.44–1.00)
Calcium: 9.3 mg/dL (ref 8.9–10.3)
Chloride: 107 mmol/L (ref 101–111)
GFR calc Af Amer: >60 mL/min (ref >60)
GFR calc non Af Amer: >60 mL/min (ref >60)
GLUCOSE: 98 mg/dL (ref 65–99)
POTASSIUM: 4 mmol/L (ref 3.5–5.1)
Sodium: 139 mmol/L (ref 135–145)

## 2016-05-06 LAB — POCT PREGNANCY, URINE: PREG TEST UR: NEGATIVE

## 2016-05-06 MED ORDER — ONDANSETRON HCL 4 MG PO TABS: 4.0000 mg | ORAL_TABLET | Freq: Three times a day (TID) | ORAL | 0 refills | Status: DC | PRN

## 2016-05-06 MED ORDER — NITROFURANTOIN MACROCRYSTAL 100 MG PO CAPS: 100.0000 mg | ORAL_CAPSULE | Freq: Four times a day (QID) | ORAL | 0 refills | Status: AC

## 2016-05-06 MED ORDER — SODIUM CHLORIDE 0.9 % IV BOLUS (SEPSIS)
1000.0000 mL | Freq: Once | INTRAVENOUS | Status: AC
  Administered 2016-05-06: 1000 mL via INTRAVENOUS

## 2016-05-06 MED ORDER — HYDROMORPHONE HCL 1 MG/ML IJ SOLN
0.5000 mg | Freq: Once | INTRAMUSCULAR | Status: AC
  Administered 2016-05-06: 0.5 mg via INTRAVENOUS
  Filled 2016-05-06: qty 1

## 2016-05-06 MED ORDER — DEXTROSE 5 % IV SOLN: 1.0000 g | Freq: Once | INTRAVENOUS | Status: DC

## 2016-05-06 MED ORDER — ACETAMINOPHEN 325 MG PO TABS
650.0000 mg | ORAL_TABLET | Freq: Once | ORAL | Status: AC
  Administered 2016-05-06: 650 mg via ORAL
  Filled 2016-05-06: qty 2

## 2016-05-06 MED ORDER — TRAMADOL HCL 50 MG PO TABS: 50.0000 mg | ORAL_TABLET | Freq: Four times a day (QID) | ORAL | 0 refills | Status: DC | PRN

## 2016-05-06 MED ORDER — CEFTRIAXONE SODIUM-DEXTROSE 1-3.74 GM-% IV SOLR
1.0000 g | Freq: Once | INTRAVENOUS | Status: AC
  Administered 2016-05-06: 1 g via INTRAVENOUS
  Filled 2016-05-06: qty 50

## 2016-05-06 MED ORDER — ONDANSETRON HCL 4 MG/2ML IJ SOLN
4.0000 mg | Freq: Once | INTRAMUSCULAR | Status: AC
  Administered 2016-05-06: 4 mg via INTRAVENOUS
  Filled 2016-05-06: qty 2

## 2016-05-06 NOTE — Discharge Instructions (Signed)
Please return immediately if condition worsens. Please contact her primary physician or the physician you were given for referral. If you have any specialist physicians involved in her treatment and plan please also contact them. Thank you for using [facility]. ° °

## 2016-05-06 NOTE — ED Provider Notes (Signed)
Time Seen: Approximately 0718 I have reviewed the triage notes  Chief Complaint: Urinary Tract Infection   History of Present Illness: Kathryn Johnston is a 21 y.o. female *who has a stated history of pyelonephritis. The patient states she also has frequent urinary tract infections and is noticed some urinary frequency and occasional burning with urination through this past week and a half. Pain is mostly right groin area and radiates more toward her right flank region. She states she had a recent C-section otherwise is been recently healthy. She denies any vomiting though she's had nausea and a decreased appetite. She denies any current vaginal discharge or bleeding.   Past Medical History:  Diagnosis Date  . Anemia   . Asthma   . DVT (deep venous thrombosis) (HCC)    high- risk in 1st pregnancy- on lovenox both pregnancy- 2nd pregnancy only used lovenox for 6 weeks- pt did not like it  . H/O trichomoniasis    2013  . Pyelonephritis    10/2014, with sepsis, hospitalized at [facility]    Patient Active Problem List   Diagnosis Date Noted  . Short interval between pregnancies affecting pregnancy in second trimester, antepartum 08/10/2015  . Asthma 08/10/2015  . Insufficient prenatal care in first trimester 08/10/2015  . IUGR (intrauterine growth restriction) in prior pregnancy, pregnant 08/10/2015  . Systemic infection (HCC) 11/18/2014  . Acute PN (pyelonephritis) 11/18/2014  . Anemia complicating pregnancy in third trimester 06/07/2014  . Personal history of urinary infection 01/12/2014  . H/O deep venous thrombosis 01/12/2014  . Infection due to trichomonas 11/20/2011    Past Surgical History:  Procedure Laterality Date  . NO PAST SURGERIES      Past Surgical History:  Procedure Laterality Date  . NO PAST SURGERIES      Current Outpatient Rx  . Order #: [id number] Class: Normal  . Order #: [id number] Class: Normal    Allergies:  Ibuprofen  Family History: Family History   Problem Relation Age of Onset  . Diabetes Mother   . Diabetes Maternal Grandmother   . Cancer Neg Hx   . Heart disease Neg Hx     Social History: Social History  Substance Use Topics  . Smoking status: Former Smoker    Packs/day: 0.50    Types: Cigarettes    Quit date: 04/20/2015  . Smokeless tobacco: Never Used  . Alcohol use No     Review of Systems:   10 point review of systems was performed and was otherwise negative:  Constitutional: No fever Eyes: No visual disturbances ENT: No sore throat, ear pain Cardiac: No chest pain Respiratory: No pleuritic pain. No productive cough Abdomen: No abdominal pain, no vomiting, No diarrhea Endocrine: No weight loss, No night sweats Extremities: No peripheral edema, cyanosis Skin: No rashes, easy bruising Neurologic: No focal weakness, trouble with speech or swollowing Urologic: Dysuria with urinary frequency and a small amount of hematuria this morning when she noticed with wiping.   Physical Exam:  ED Triage Vitals  Enc Vitals Group     BP 05/06/16 0654 120/74     Pulse Rate 05/06/16 0654 (!) 134     Resp 05/06/16 0654 20     Temp 05/06/16 0654 (!) 101.5 F (38.6 C)     Temp Source 05/06/16 0654 Oral     SpO2 05/06/16 0654 97 %     Weight 05/06/16 0651 120 lb (54.4 kg)     Height 05/06/16 0651 5\' 3"  (1.6 m)  Head Circumference --      Peak Flow --      Pain Score 05/06/16 0651 10     Pain Loc --      Pain Edu? --      Excl. in GC? --     General: Awake , Alert , and Oriented times 3; GCS 15 Head: Normal cephalic , atraumatic Eyes: Pupils equal , round, reactive to light Nose/Throat: No nasal drainage, patent upper airway without erythema or exudate.  Neck: Supple, Full range of motion, No anterior adenopathy or palpable thyroid masses Lungs: Clear to ascultation without wheezes , rhonchi, or rales Heart: Regular rate, regular rhythm without murmurs , gallops , or rubs Abdomen: Soft, non tender without  rebound, guarding , or rigidity; bowel sounds positive and symmetric in all 4 quadrants. No organomegaly .No focal tenderness over McBurney's point. Mild tenderness right flank region        Extremities: 2 plus symmetric pulses. No edema, clubbing or cyanosis Neurologic: normal ambulation, Motor symmetric without deficits, sensory intact Skin: warm, dry, no rashes   Labs:   All laboratory work was reviewed including any pertinent negatives or positives listed below:  Labs Reviewed  URINALYSIS, ROUTINE W REFLEX MICROSCOPIC - Abnormal; Notable for the following:       Result Value   Color, Urine YELLOW (*)    APPearance CLOUDY (*)    Hgb urine dipstick SMALL (*)    Protein, ur 30 (*)    Leukocytes, UA LARGE (*)    Bacteria, UA MANY (*)    Squamous Epithelial / LPF 0-5 (*)    All other components within normal limits  CBC WITH DIFFERENTIAL/PLATELET  BASIC METABOLIC PANEL  POCT PREGNANCY, URINE  Patient's urine was positive for urinary tract infection and urine culture was added.  Radiology:  "Ct Renal Stone Study  Result Date: 05/06/2016 CLINICAL DATA:  Burning with urination.  Hematuria. EXAM: CT ABDOMEN AND PELVIS WITHOUT CONTRAST TECHNIQUE: Multidetector CT imaging of the abdomen and pelvis was performed following the standard protocol without IV contrast. COMPARISON:  11/14/2016 FINDINGS: Lower chest: Normal Hepatobiliary: Normal Pancreas: Normal Spleen: Normal Adrenals/Urinary Tract: Adrenal glands are normal. Kidneys are normal in size shape and position, with the exception of a a 1 mm stone in the midportion of the right kidney. No hydronephrosis. No sign of passing stone. No stone in the bladder. Stomach/Bowel: Normal.  Normal appendix. Vascular/Lymphatic: Normal Reproductive: Normal.  No pelvic mass. Other: No free fluid or air. Musculoskeletal: Normal IMPRESSION: Normal CT of the abdomen and pelvis without contrast, with exception of a punctate/ 1 mm calcification in the right  kidney. No evidence of obstruction or passing stone. No evidence of urinary tract inflammatory disease on this noncontrast exam. Electronically Signed   By: Paulina FusiMark  Shogry M.D.   On: 05/06/2016 08:31  "   I personally reviewed the radiologic studies    ED Course:  Patient's stay showed some symptomatic improvement and she was started on IV Rocephin 1 g for some diagnosis of pyelonephritis. She has been able to maintain fluid intake here in emergency department and I felt she would do well on an outpatient basis. She was advised continue with Tylenol for pain and fever and plenty of fluids and will be placed on antibiotic therapy. Differential also includes acute appendicitis, renal colic, etc.     Assessment: * Acute pyelonephritis      Plan:  Outpatient management " New Prescriptions   NITROFURANTOIN (MACRODANTIN) 100 MG CAPSULE  Take 1 capsule (100 mg total) by mouth 4 (four) times daily.   ONDANSETRON (ZOFRAN) 4 MG TABLET    Take 1 tablet (4 mg total) by mouth every 8 (eight) hours as needed for nausea or vomiting.   TRAMADOL (ULTRAM) 50 MG TABLET    Take 1 tablet (50 mg total) by mouth every 6 (six) hours as needed.  " Patient was advised to return immediately if condition worsens. Patient was advised to follow up with their primary care physician or other specialized physicians involved in their outpatient care. The patient and/or family member/power of attorney had laboratory results reviewed at the bedside. All questions and concerns were addressed and appropriate discharge instructions were distributed by the nursing staff.             Jennye Moccasin, MD 05/06/16 [phone]

## 2016-05-06 NOTE — ED Triage Notes (Signed)
Pt reports hx of kidney infections. Pt reports burning upon urination and some blood when wiping. Pt states that S/S have been going on for a week and a half.  Pt is ambulatory to triage with NAD noted at this time.

## 2016-05-09 LAB — URINE CULTURE: Culture: >=100000 — AB

## 2018-01-08 IMAGING — US US OB TRANSVAGINAL
1 series · 14 of 28 positions shown · non-contrast
Comparison: None.

CLINICAL DATA: Low back pain for 3 days.  First-trimester pregnancy

EXAM:
OBSTETRIC <14 WK US AND TRANSVAGINAL OB US
TECHNIQUE: Both transabdominal and transvaginal ultrasound examinations were
performed for complete evaluation of the gestation as well as the
maternal uterus, adnexal regions, and pelvic cul-de-sac.
Transvaginal technique was performed to assess early pregnancy.

[Series 1: us ob transvaginal · 0.20mm/px · 62 acquisitions, 14 frames shown]
[im 3/62]
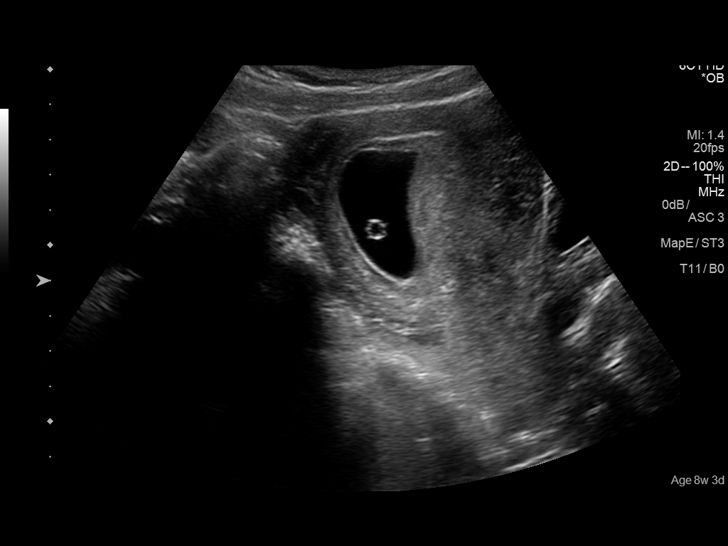
[im 7/62]
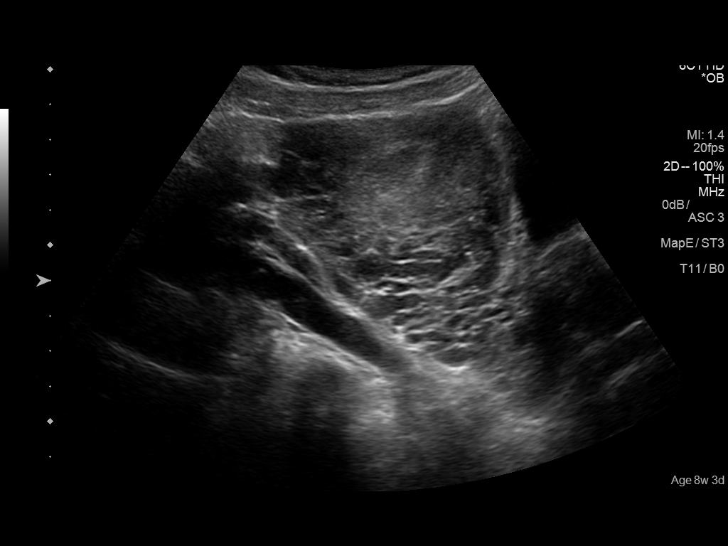
[im 12/62]
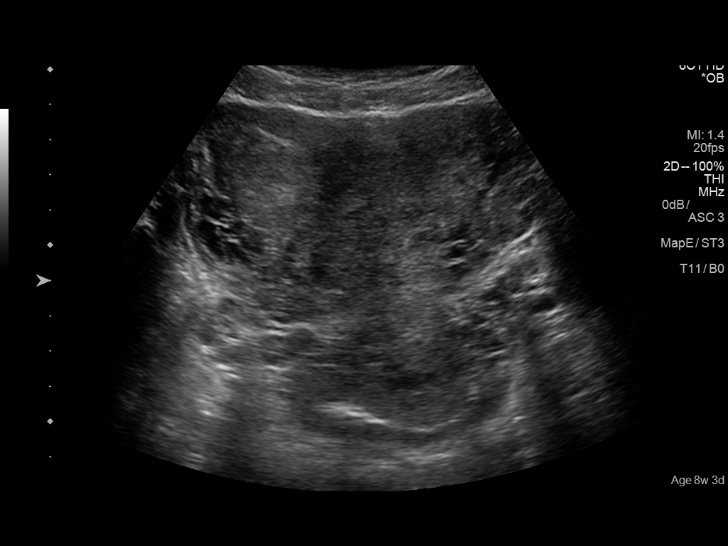
[im 16/62]
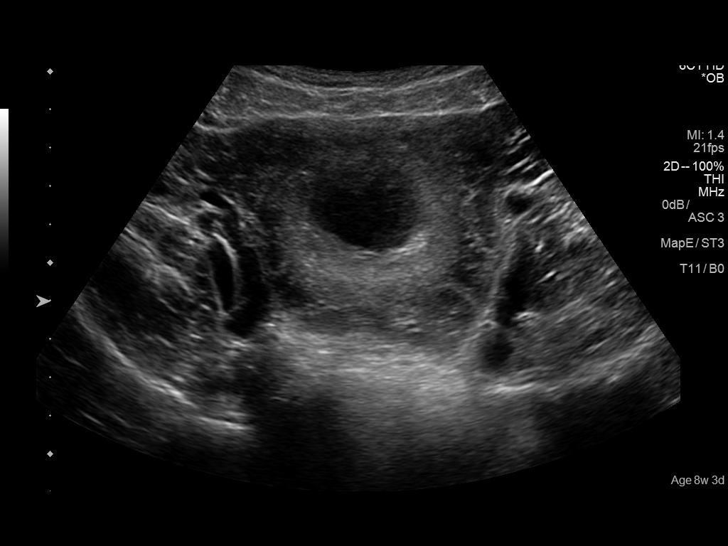
[im 21/62]
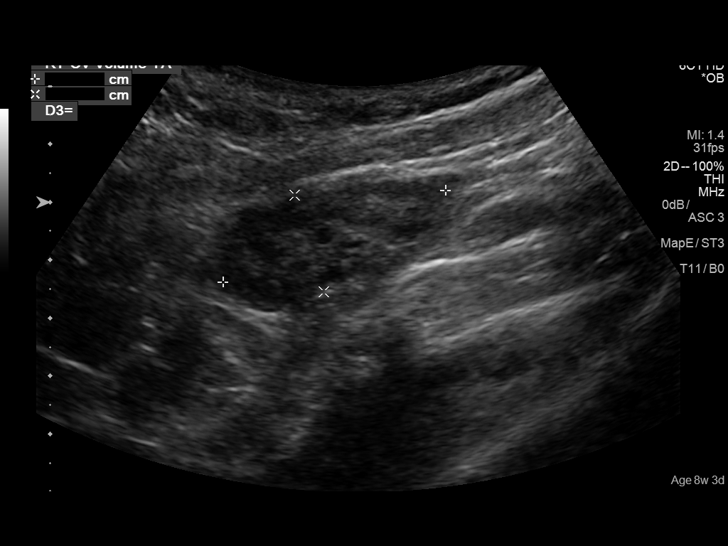
[im 25/62]
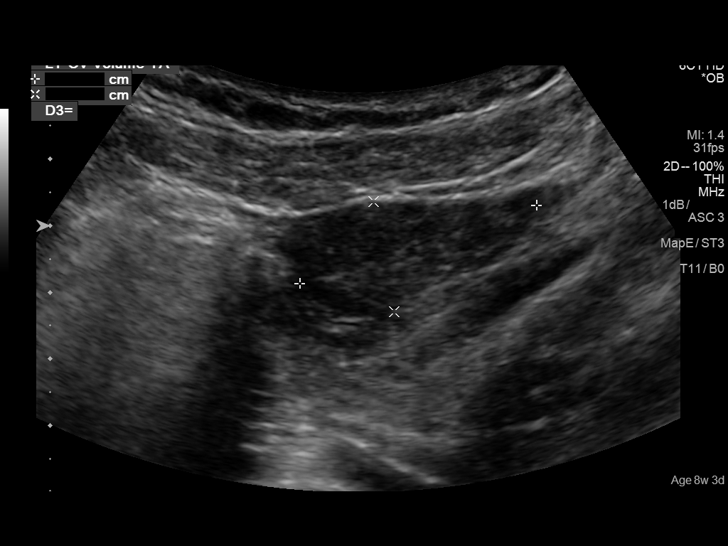
[im 30/62]
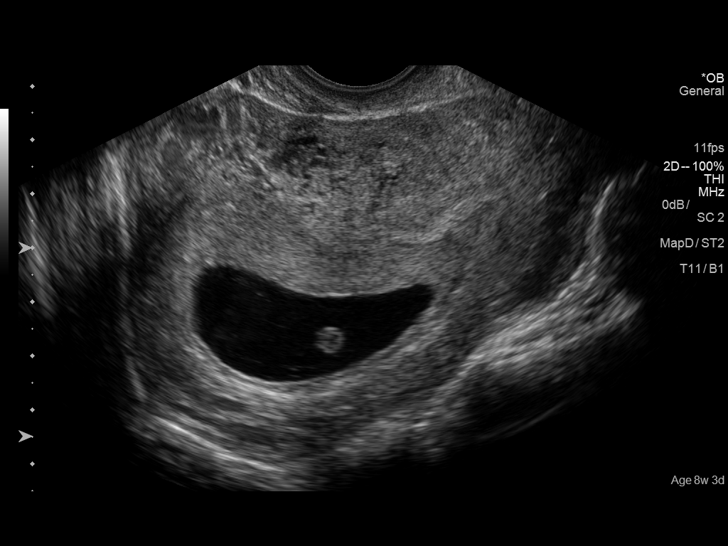
[im 34/62]
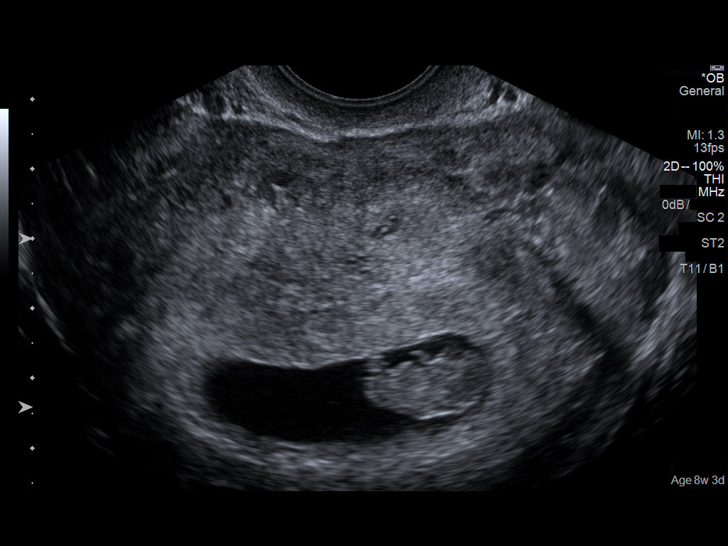
[im 39/62]
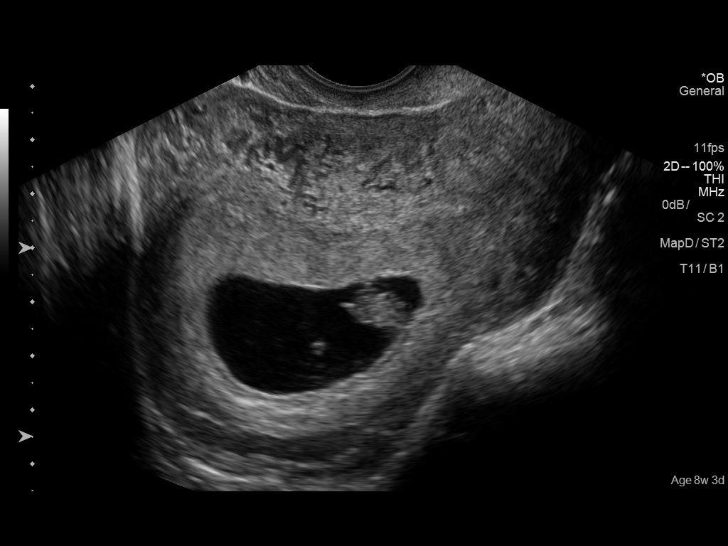
[im 43/62]
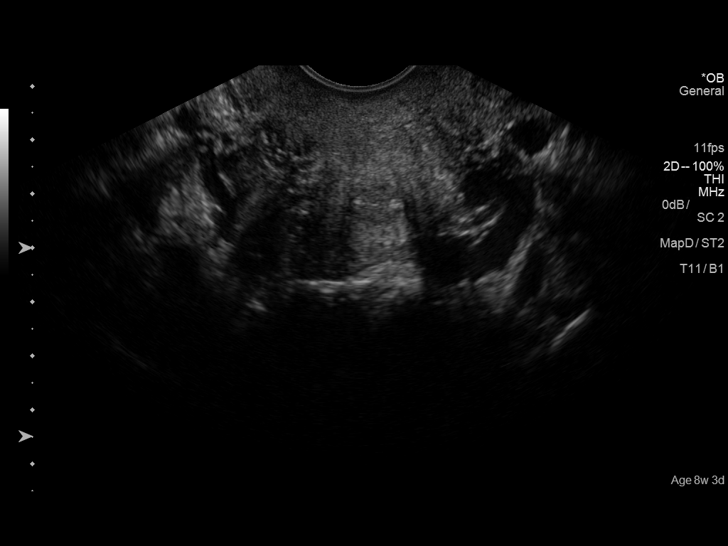
[im 48/62]
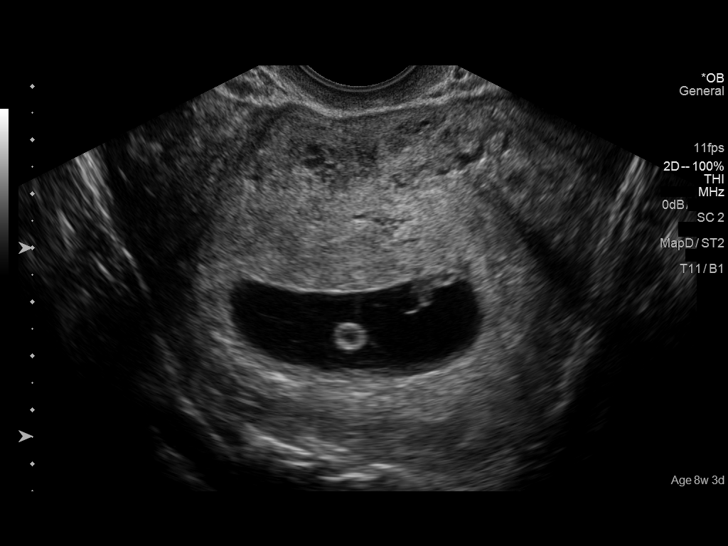
[im 52/62]
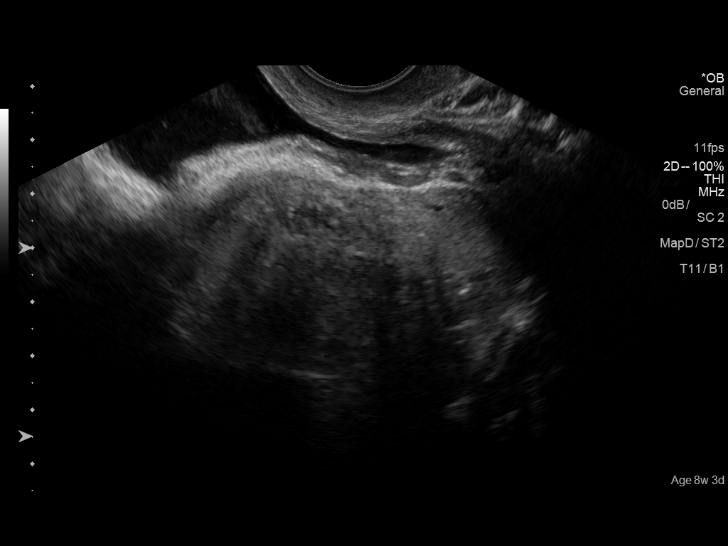
[im 57/62]
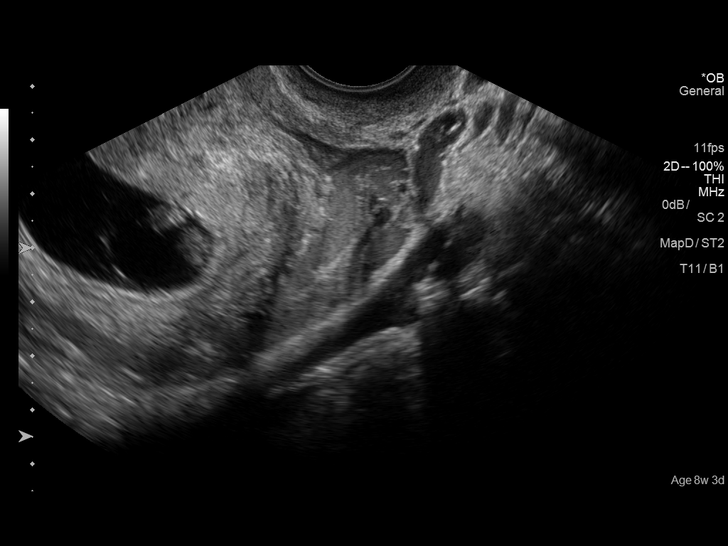
[im 62/62]
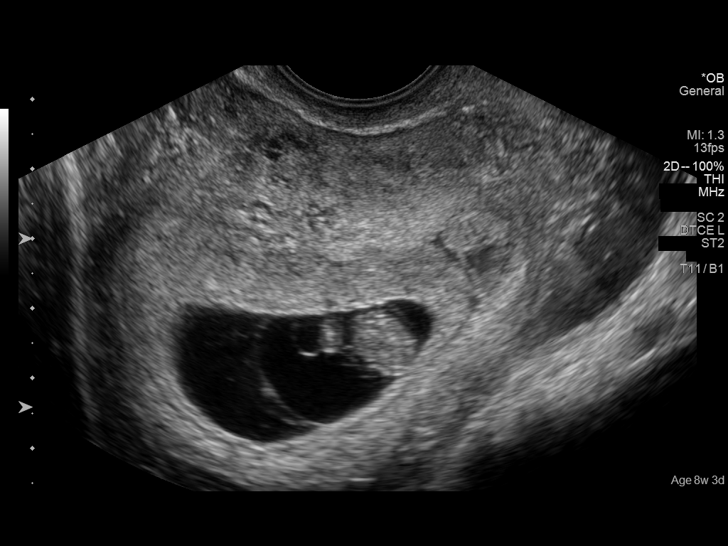

[14 of 28 positions shown; findings below may reference images not displayed]

FINDINGS: Intrauterine gestational sac: Present

Yolk sac:  Present

Embryo:  Present

Cardiac Activity: Present

Heart Rate: 162  bpm

CRL: 19.5 mm 8 w 4 d US EDC: 01/20/2016. Good correlation with
clinical dates.

Subchorionic hemorrhage:  None visualized.

Maternal uterus/adnexae: Normal appearance of the ovaries. No
adnexal mass.
IMPRESSION: Single living intrauterine pregnancy measuring 8 weeks 4 days. No
adverse finding.

## 2019-08-31 ENCOUNTER — Emergency Department: Payer: Medicaid Other

## 2019-08-31 ENCOUNTER — Emergency Department
Admission: EM | Admit: 2019-08-31 | Discharge: 2019-08-31 | Disposition: A | Discharge status: Home / Self Care | Payer: Medicaid Other | Attending: Emergency Medicine | Admitting: Emergency Medicine

## 2019-08-31 ENCOUNTER — Other Ambulatory Visit: Payer: Self-pay

## 2019-08-31 ENCOUNTER — Encounter: Payer: Self-pay | Admitting: Emergency Medicine

## 2019-08-31 DIAGNOSIS — R300 Dysuria: Secondary | ICD-10-CM | POA: Diagnosis present

## 2019-08-31 DIAGNOSIS — Z87891 Personal history of nicotine dependence: Secondary | ICD-10-CM | POA: Diagnosis not present

## 2019-08-31 DIAGNOSIS — R509 Fever, unspecified: Secondary | ICD-10-CM | POA: Diagnosis not present

## 2019-08-31 DIAGNOSIS — J45909 Unspecified asthma, uncomplicated: Secondary | ICD-10-CM | POA: Insufficient documentation

## 2019-08-31 DIAGNOSIS — N390 Urinary tract infection, site not specified: Secondary | ICD-10-CM

## 2019-08-31 DIAGNOSIS — R109 Unspecified abdominal pain: Secondary | ICD-10-CM | POA: Insufficient documentation

## 2019-08-31 LAB — CBC WITH DIFFERENTIAL/PLATELET
Abs Immature Granulocytes: 0.04 10*3/uL (ref 0.00–0.07)
Basophils Absolute: 0 10*3/uL (ref 0.0–0.1)
Basophils Relative: 0 %
Eosinophils Absolute: 0 10*3/uL (ref 0.0–0.5)
Eosinophils Relative: 0 %
HCT: 36.5 % (ref 36.0–46.0)
Hemoglobin: 11.2 g/dL — ABNORMAL LOW (ref 12.0–15.0)
Immature Granulocytes: 1 %
Lymphocytes Relative: 28 %
Lymphs Abs: 2.3 10*3/uL (ref 0.7–4.0)
MCH: 23.7 pg — ABNORMAL LOW (ref 26.0–34.0)
MCHC: 30.7 g/dL (ref 30.0–36.0)
MCV: 77.2 fL — ABNORMAL LOW (ref 80.0–100.0)
Monocytes Absolute: 0.7 10*3/uL (ref 0.1–1.0)
Monocytes Relative: 8 %
Neutro Abs: 5.2 10*3/uL (ref 1.7–7.7)
Neutrophils Relative %: 63 %
Platelets: 259 10*3/uL (ref 150–400)
RBC: 4.73 MIL/uL (ref 3.87–5.11)
RDW: 12.8 % (ref 11.5–15.5)
WBC: 8.3 10*3/uL (ref 4.0–10.5)
nRBC: 0 % (ref 0.0–0.2)

## 2019-08-31 LAB — COMPREHENSIVE METABOLIC PANEL
ALT: 11 U/L (ref 0–44)
AST: 15 U/L (ref 15–41)
Albumin: 4.5 g/dL (ref 3.5–5.0)
Alkaline Phosphatase: 46 U/L (ref 38–126)
Anion gap: 9 (ref 5–15)
BUN: 9 mg/dL (ref 6–20)
CO2: 26 mmol/L (ref 22–32)
Calcium: 9.3 mg/dL (ref 8.9–10.3)
Chloride: 101 mmol/L (ref 98–111)
Creatinine, Ser: 0.92 mg/dL (ref 0.44–1.00)
GFR calc Af Amer: >60 mL/min (ref >60)
GFR calc non Af Amer: >60 mL/min (ref >60)
Glucose, Bld: 90 mg/dL (ref 70–99)
Potassium: 4 mmol/L (ref 3.5–5.1)
Sodium: 136 mmol/L (ref 135–145)
Total Bilirubin: 0.9 mg/dL (ref 0.3–1.2)
Total Protein: 8.2 g/dL — ABNORMAL HIGH (ref 6.5–8.1)

## 2019-08-31 LAB — URINALYSIS, COMPLETE (UACMP) WITH MICROSCOPIC
Bilirubin Urine: NEGATIVE
Glucose, UA: NEGATIVE mg/dL
Hgb urine dipstick: NEGATIVE
Ketones, ur: NEGATIVE mg/dL
Nitrite: NEGATIVE
Protein, ur: NEGATIVE mg/dL
Specific Gravity, Urine: 1.024 (ref 1.005–1.030)
pH: 5 (ref 5.0–8.0)

## 2019-08-31 MED ORDER — CEFDINIR 300 MG PO CAPS
300.0000 mg | ORAL_CAPSULE | Freq: Two times a day (BID) | ORAL | 0 refills | Status: DC
Start: 2019-08-31 — End: 2023-11-16

## 2019-08-31 MED ORDER — SODIUM CHLORIDE 0.9 % IV BOLUS
1000.0000 mL | Freq: Once | INTRAVENOUS | Status: AC
  Administered 2019-08-31: 1000 mL via INTRAVENOUS

## 2019-08-31 MED ORDER — MORPHINE SULFATE (PF) 4 MG/ML IV SOLN
4.0000 mg | Freq: Once | INTRAVENOUS | Status: AC
  Administered 2019-08-31: 4 mg via INTRAVENOUS
  Filled 2019-08-31: qty 1

## 2019-08-31 MED ORDER — ONDANSETRON HCL 4 MG/2ML IJ SOLN
4.0000 mg | Freq: Once | INTRAMUSCULAR | Status: AC
  Administered 2019-08-31: 4 mg via INTRAVENOUS
  Filled 2019-08-31: qty 2

## 2019-08-31 NOTE — ED Notes (Signed)
Urine POC negative. 

## 2019-08-31 NOTE — ED Provider Notes (Signed)
[facility] Emergency Department Provider Note  ____________________________________________   First MD Initiated Contact with Patient 08/31/19 1619     (approximate)  I have reviewed the triage vital signs and the nursing notes.   HISTORY  Chief Complaint Urinary Tract Infection    HPI Kathryn Johnston is a 24 y.o. female presents emergency department with fever, chills, back pain and dysuria.  Patient has history of pyelonephritis.  Denies any vaginal discharge or concerns for STD at this time.  No vomiting.  Does have some nausea.  No diarrhea.    Past Medical History:  Diagnosis Date  . Anemia   . Asthma   . DVT (deep venous thrombosis) (HCC)    high- risk in 1st pregnancy- on lovenox both pregnancy- 2nd pregnancy only used lovenox for 6 weeks- pt did not like it  . H/O trichomoniasis    2013  . Pyelonephritis    10/2014, with sepsis, hospitalized at [facility]    Patient Active Problem List   Diagnosis Date Noted  . Short interval between pregnancies affecting pregnancy in second trimester, antepartum 08/10/2015  . Asthma 08/10/2015  . Insufficient prenatal care in first trimester 08/10/2015  . IUGR (intrauterine growth restriction) in prior pregnancy, pregnant 08/10/2015  . Systemic infection (HCC) 11/18/2014  . Acute PN (pyelonephritis) 11/18/2014  . Anemia complicating pregnancy in third trimester 06/07/2014  . Personal history of urinary infection 01/12/2014  . H/O deep venous thrombosis 01/12/2014  . Infection due to trichomonas 11/20/2011    Past Surgical History:  Procedure Laterality Date  . NO PAST SURGERIES      Prior to Admission medications   Medication Sig Start Date End Date Taking? Authorizing Provider  cefdinir (OMNICEF) 300 MG capsule Take 1 capsule (300 mg total) by mouth 2 (two) times daily. 08/31/19   Jasmen Emrich, Roselyn Bering, PA-C  ferrous sulfate 325 (65 FE) MG tablet Take 1 tablet (325 mg total) by mouth 2 (two) times daily with  a meal. 07/11/15   Hildred Laser, MD  ondansetron (ZOFRAN) 4 MG tablet Take 1 tablet (4 mg total) by mouth every 8 (eight) hours as needed for nausea or vomiting. 05/06/16   Jennye Moccasin, MD  Prenat-FeFum-FePo-FA-Omega 3 (CONCEPT DHA) 53.5-38-1 MG CAPS Take 53.5 mg by mouth daily. 07/08/15   Defrancesco, Prentice Docker, MD  traMADol (ULTRAM) 50 MG tablet Take 1 tablet (50 mg total) by mouth every 6 (six) hours as needed. 05/06/16   Jennye Moccasin, MD    Allergies Ibuprofen  Family History  Problem Relation Age of Onset  . Diabetes Mother   . Diabetes Maternal Grandmother   . Cancer Neg Hx   . Heart disease Neg Hx     Social History Social History   Tobacco Use  . Smoking status: Former Smoker    Packs/day: 0.50    Types: Cigarettes    Quit date: 04/20/2015    Years since quitting: 4.3  . Smokeless tobacco: Never Used  Vaping Use  . Vaping Use: Never used  Substance Use Topics  . Alcohol use: No  . Drug use: No    Review of Systems  Constitutional: Positive fever/chills Eyes: No visual changes. ENT: No sore throat. Respiratory: Denies cough Cardiovascular: Denies chest pain Gastrointestinal: Positive abdominal pain Genitourinary: Positive for dysuria. Musculoskeletal: Negative for back pain. Skin: Negative for rash. Psychiatric: no mood changes,     ____________________________________________   PHYSICAL EXAM:  VITAL SIGNS: ED Triage Vitals  Enc Vitals Group  BP 08/31/19 1611 119/69     Pulse Rate 08/31/19 1611 (!) 107     Resp 08/31/19 1611 16     Temp 08/31/19 1611 99.2 F (37.3 C)     Temp Source 08/31/19 1611 Oral     SpO2 08/31/19 1611 100 %     Weight 08/31/19 1612 150 lb (68 kg)     Height 08/31/19 1612 5\' 2"  (1.575 m)     Head Circumference --      Peak Flow --      Pain Score 08/31/19 1612 10     Pain Loc --      Pain Edu? --      Excl. in GC? --     Constitutional: Alert and oriented. Well appearing and in no acute distress. Eyes:  Conjunctivae are normal.  Head: Atraumatic. Nose: No congestion/rhinnorhea. Mouth/Throat: Mucous membranes are moist.   Neck:  supple no lymphadenopathy noted Cardiovascular: Normal rate, regular rhythm. Heart sounds are normal Respiratory: Normal respiratory effort.  No retractions, lungs c t a  Abd: soft tender along the right lower quadrant, CVA tenderness is reproduces a lot of pain to the point the patient starts to cry, bs normal all 4 quad GU: deferred Musculoskeletal: FROM all extremities, warm and well perfused Neurologic:  Normal speech and language.  Skin:  Skin is warm, dry and intact. No rash noted. Psychiatric: Mood and affect are normal. Speech and behavior are normal.  ____________________________________________   LABS (all labs ordered are listed, but only abnormal results are displayed)  Labs Reviewed  URINALYSIS, COMPLETE (UACMP) WITH MICROSCOPIC - Abnormal; Notable for the following components:      Result Value   Color, Urine YELLOW (*)    APPearance CLOUDY (*)    Leukocytes,Ua TRACE (*)    Bacteria, UA MANY (*)    All other components within normal limits  COMPREHENSIVE METABOLIC PANEL - Abnormal; Notable for the following components:   Total Protein 8.2 (*)    All other components within normal limits  CBC WITH DIFFERENTIAL/PLATELET - Abnormal; Notable for the following components:   Hemoglobin 11.2 (*)    MCV 77.2 (*)    MCH 23.7 (*)    All other components within normal limits  URINE CULTURE  POC URINE PREG, ED   ____________________________________________   ____________________________________________  RADIOLOGY  CT renal stone study is negative for renal stone or pyelonephritis  ____________________________________________   PROCEDURES  Procedure(s) performed: No  Procedures    ____________________________________________   INITIAL IMPRESSION / ASSESSMENT AND PLAN / ED COURSE  Pertinent labs & imaging results that were  available during my care of the patient were reviewed by me and considered in my medical decision making (see chart for details).   Patient is a 24 year old female presents emergency department with fever, chills, back pain, dysuria with history of pyelonephritis.  Physical exam shows patient to appear stable.  Right lower quadrant area is tender to palpation, CVA tenderness noted on the right  POC pregnancy is negative, UA, CBC, comprehensive metabolic panel CT renal stone study   CT renal stone study is negative.  Explained findings to the patient.  Labs are normal and reassuring.  There is a large amount of bacteria noted in the urine so we will place a order for urine culture.  Did start the patient due to her past medical history on Omnicef 300 twice daily for 7 days.  She is to follow-up with her regular doctor if not improving  in 2 3 days.  Return emergency department worsening.  She still is denying any need to test for STDs/PID.  She still refusing a pelvic.  She was discharged in stable condition.   As part of my medical decision making, I reviewed the following data within the electronic MEDICAL RECORD NUMBER Nursing notes reviewed and incorporated, Labs reviewed , Old chart reviewed, Radiograph reviewed , Notes from prior ED visits and [place] Controlled Substance Database  ____________________________________________   FINAL CLINICAL IMPRESSION(S) / ED DIAGNOSES  Final diagnoses:  Urinary tract infection without hematuria, site unspecified      NEW MEDICATIONS STARTED DURING THIS VISIT:  New Prescriptions   CEFDINIR (OMNICEF) 300 MG CAPSULE    Take 1 capsule (300 mg total) by mouth 2 (two) times daily.     Note:  This document was prepared using Dragon voice recognition software and may include unintentional dictation errors.    Faythe Ghee, PA-C 08/31/19 1831    Jene Every, MD 08/31/19 [phone]

## 2019-08-31 NOTE — ED Triage Notes (Signed)
First Nurse Note:  C/O burning with urination , low back pain, and fever.  patient is AAOx3.  Skin warm and dry. NAD

## 2019-08-31 NOTE — ED Triage Notes (Signed)
Pt in via POV, reports dysuria, low back pain and fever x 2 days.  Reports taking Tylenol prior to arrival.  Ambulatory to triage, NAD noted at this time.

## 2019-08-31 NOTE — Discharge Instructions (Addendum)
Follow-up with regular doctor if not improving in 2 to 3 days.  Return emergency department for worsening.  Take medication as prescribed.  Drink plenty fluids.

## 2019-09-03 LAB — URINE CULTURE: Culture: >=100000 — AB

## 2019-12-20 ENCOUNTER — Other Ambulatory Visit: Payer: Self-pay

## 2019-12-20 DIAGNOSIS — J45909 Unspecified asthma, uncomplicated: Secondary | ICD-10-CM | POA: Diagnosis not present

## 2019-12-20 DIAGNOSIS — E860 Dehydration: Secondary | ICD-10-CM | POA: Insufficient documentation

## 2019-12-20 DIAGNOSIS — Z87891 Personal history of nicotine dependence: Secondary | ICD-10-CM | POA: Diagnosis not present

## 2019-12-20 DIAGNOSIS — R531 Weakness: Secondary | ICD-10-CM | POA: Diagnosis present

## 2019-12-21 ENCOUNTER — Emergency Department: Payer: Medicaid Other

## 2019-12-21 ENCOUNTER — Emergency Department
Admission: EM | Admit: 2019-12-21 | Discharge: 2019-12-21 | Disposition: A | Discharge status: Home / Self Care | Payer: Medicaid Other | Attending: Emergency Medicine | Admitting: Emergency Medicine

## 2019-12-21 DIAGNOSIS — E860 Dehydration: Secondary | ICD-10-CM

## 2019-12-21 DIAGNOSIS — R531 Weakness: Secondary | ICD-10-CM

## 2019-12-21 LAB — URINALYSIS, COMPLETE (UACMP) WITH MICROSCOPIC
Bilirubin Urine: NEGATIVE
Glucose, UA: NEGATIVE mg/dL
Hgb urine dipstick: NEGATIVE
Ketones, ur: NEGATIVE mg/dL
Leukocytes,Ua: NEGATIVE
Nitrite: NEGATIVE
Protein, ur: NEGATIVE mg/dL
Specific Gravity, Urine: 1.012 (ref 1.005–1.030)
Squamous Epithelial / HPF: NONE SEEN (ref 0–5)
pH: 6 (ref 5.0–8.0)

## 2019-12-21 LAB — URINE DRUG SCREEN, QUALITATIVE (ARMC ONLY)
Amphetamines, Ur Screen: NOT DETECTED
Barbiturates, Ur Screen: NOT DETECTED
Benzodiazepine, Ur Scrn: NOT DETECTED
Cannabinoid 50 Ng, Ur ~~LOC~~: POSITIVE — AB
Cocaine Metabolite,Ur ~~LOC~~: NOT DETECTED
MDMA (Ecstasy)Ur Screen: NOT DETECTED
Methadone Scn, Ur: NOT DETECTED
Opiate, Ur Screen: NOT DETECTED
Phencyclidine (PCP) Ur S: NOT DETECTED
Tricyclic, Ur Screen: NOT DETECTED

## 2019-12-21 LAB — CBC
HCT: 39.1 % (ref 36.0–46.0)
Hemoglobin: 11.9 g/dL — ABNORMAL LOW (ref 12.0–15.0)
MCH: 23.8 pg — ABNORMAL LOW (ref 26.0–34.0)
MCHC: 30.4 g/dL (ref 30.0–36.0)
MCV: 78 fL — ABNORMAL LOW (ref 80.0–100.0)
Platelets: 358 10*3/uL (ref 150–400)
RBC: 5.01 MIL/uL (ref 3.87–5.11)
RDW: 12.4 % (ref 11.5–15.5)
WBC: 11.5 10*3/uL — ABNORMAL HIGH (ref 4.0–10.5)
nRBC: 0 % (ref 0.0–0.2)

## 2019-12-21 LAB — BASIC METABOLIC PANEL
Anion gap: 11 (ref 5–15)
BUN: 12 mg/dL (ref 6–20)
CO2: 22 mmol/L (ref 22–32)
Calcium: 8.9 mg/dL (ref 8.9–10.3)
Chloride: 106 mmol/L (ref 98–111)
Creatinine, Ser: 0.81 mg/dL (ref 0.44–1.00)
GFR, Estimated: >60 mL/min (ref >60)
Glucose, Bld: 124 mg/dL — ABNORMAL HIGH (ref 70–99)
Potassium: 3.5 mmol/L (ref 3.5–5.1)
Sodium: 139 mmol/L (ref 135–145)

## 2019-12-21 LAB — TROPONIN I (HIGH SENSITIVITY)
Troponin I (High Sensitivity): 4 ng/L (ref <18)
Troponin I (High Sensitivity): 4 ng/L (ref <18)

## 2019-12-21 MED ORDER — SODIUM CHLORIDE 0.9 % IV BOLUS
1000.0000 mL | Freq: Once | INTRAVENOUS | Status: AC
  Administered 2019-12-21: 1000 mL via INTRAVENOUS

## 2019-12-21 NOTE — ED Notes (Signed)
Pt assisted to bathroom at this time.

## 2019-12-21 NOTE — ED Provider Notes (Signed)
[facility] Emergency Department Provider Note   ____________________________________________   First MD Initiated Contact with Patient 12/21/19 0009     (approximate)  I have reviewed the triage vital signs and the nursing notes.   HISTORY  Chief Complaint Weakness and Chest Pain    HPI Kathryn Johnston is a 24 y.o. female with stated past medical history of asthma and polysubstance abuse who presents for generalized weakness and confusion after drinking an unknown substance that patient assumed was a liquid opiate.  Patient states that she does not know exactly what she drank and is concerned she may have been "dosed with something".  Patient also endorses associated palpitations without shortness of breath patient also endorses alcohol use this evening prior to arrival.         Past Medical History:  Diagnosis Date  . Anemia   . Asthma   . DVT (deep venous thrombosis) (HCC)    high- risk in 1st pregnancy- on lovenox both pregnancy- 2nd pregnancy only used lovenox for 6 weeks- pt did not like it  . H/O trichomoniasis    2013  . Pyelonephritis    10/2014, with sepsis, hospitalized at [facility]    Patient Active Problem List   Diagnosis Date Noted  . Short interval between pregnancies affecting pregnancy in second trimester, antepartum 08/10/2015  . Asthma 08/10/2015  . Insufficient prenatal care in first trimester 08/10/2015  . IUGR (intrauterine growth restriction) in prior pregnancy, pregnant 08/10/2015  . Systemic infection (HCC) 11/18/2014  . Acute PN (pyelonephritis) 11/18/2014  . Anemia complicating pregnancy in third trimester 06/07/2014  . Personal history of urinary infection 01/12/2014  . H/O deep venous thrombosis 01/12/2014  . Infection due to trichomonas 11/20/2011    Past Surgical History:  Procedure Laterality Date  . NO PAST SURGERIES      Prior to Admission medications   Medication Sig Start Date End Date Taking? Authorizing  Provider  cefdinir (OMNICEF) 300 MG capsule Take 1 capsule (300 mg total) by mouth 2 (two) times daily. 08/31/19   Fisher, Roselyn Bering, PA-C  ferrous sulfate 325 (65 FE) MG tablet Take 1 tablet (325 mg total) by mouth 2 (two) times daily with a meal. 07/11/15   Hildred Laser, MD  ondansetron (ZOFRAN) 4 MG tablet Take 1 tablet (4 mg total) by mouth every 8 (eight) hours as needed for nausea or vomiting. 05/06/16   Jennye Moccasin, MD  Prenat-FeFum-FePo-FA-Omega 3 (CONCEPT DHA) 53.5-38-1 MG CAPS Take 53.5 mg by mouth daily. 07/08/15   Defrancesco, Prentice Docker, MD  traMADol (ULTRAM) 50 MG tablet Take 1 tablet (50 mg total) by mouth every 6 (six) hours as needed. 05/06/16   Jennye Moccasin, MD    Allergies Ibuprofen  Family History  Problem Relation Age of Onset  . Diabetes Mother   . Diabetes Maternal Grandmother   . Cancer Neg Hx   . Heart disease Neg Hx     Social History Social History   Tobacco Use  . Smoking status: Former Smoker    Packs/day: 0.50    Types: Cigarettes    Quit date: 04/20/2015    Years since quitting: 4.6  . Smokeless tobacco: Never Used  Vaping Use  . Vaping Use: Never used  Substance Use Topics  . Alcohol use: No  . Drug use: No    Review of Systems Constitutional: No fever/chills Eyes: No visual changes. ENT: No sore throat. Cardiovascular: Denies chest pain. Respiratory: Denies shortness of breath. Gastrointestinal:  No abdominal pain.  No nausea, no vomiting.  No diarrhea. Genitourinary: Negative for dysuria. Musculoskeletal: Negative for acute arthralgias Skin: Negative for rash. Neurological: Negative for headaches, numbness/paresthesias in any extremity Psychiatric: Negative for suicidal ideation/homicidal ideation   ____________________________________________   PHYSICAL EXAM:  VITAL SIGNS: ED Triage Vitals  Enc Vitals Group     BP 12/21/19 0005 123/75     Pulse Rate 12/21/19 0005 (!) 162     Resp 12/21/19 0005 20     Temp 12/21/19 0005 99.1  F (37.3 C)     Temp src --      SpO2 12/21/19 0005 100 %     Weight 12/21/19 0006 130 lb (59 kg)     Height 12/21/19 0006 5\' 2"  (1.575 m)     Head Circumference --      Peak Flow --      Pain Score 12/21/19 0005 7     Pain Loc --      Pain Edu? --      Excl. in GC? --    Constitutional: Alert and oriented. Well appearing and in no acute distress. Eyes: Bilateral conjunctival injection. PERRL. Head: Atraumatic. Nose: No congestion/rhinnorhea. Mouth/Throat: Mucous membranes are moist. Neck: No stridor Cardiovascular: Grossly normal heart sounds.  Good peripheral circulation. Respiratory: Normal respiratory effort.  No retractions. Gastrointestinal: Soft and nontender. No distention. Musculoskeletal: No obvious deformities Neurologic:  Normal speech and language. No gross focal neurologic deficits are appreciated. Skin:  Skin is warm and dry. No rash noted. Psychiatric: Mood and affect are normal. Speech and behavior are normal.  ____________________________________________   LABS (all labs ordered are listed, but only abnormal results are displayed)  Labs Reviewed  BASIC METABOLIC PANEL - Abnormal; Notable for the following components:      Result Value   Glucose, Bld 124 (*)    All other components within normal limits  CBC - Abnormal; Notable for the following components:   WBC 11.5 (*)    Hemoglobin 11.9 (*)    MCV 78.0 (*)    MCH 23.8 (*)    All other components within normal limits  URINE DRUG SCREEN, QUALITATIVE ([facility] ONLY) - Abnormal; Notable for the following components:   Cannabinoid 50 Ng, Ur Declo POSITIVE (*)    All other components within normal limits  URINALYSIS, COMPLETE (UACMP) WITH MICROSCOPIC - Abnormal; Notable for the following components:   Color, Urine YELLOW (*)    APPearance CLEAR (*)    Bacteria, UA RARE (*)    All other components within normal limits  POC URINE PREG, ED  TROPONIN I (HIGH SENSITIVITY)  TROPONIN I (HIGH SENSITIVITY)    ____________________________________________  EKG  ED ECG REPORT I, 13/01/21, the attending physician, personally viewed and interpreted this ECG.  Date: 12/21/2019 EKG Time: 0007 Rate: 137 Rhythm: Tachycardic sinus rhythm QRS Axis: normal Intervals: normal ST/T Wave abnormalities: normal Narrative Interpretation: no evidence of acute ischemia  ____________________________________________  RADIOLOGY  ED MD interpretation: Single view x-ray of the chest shows no evidence of acute abnormalities including no pneumothorax, pneumonia, or widened mediastinum  Official radiology report(s): DG Chest 1 View  Result Date: 12/21/2019 CLINICAL DATA:  Tachycardia, chest pain EXAM: CHEST  1 VIEW COMPARISON:  08/20/2014 FINDINGS: The heart size and mediastinal contours are within normal limits. Both lungs are clear. The visualized skeletal structures are unremarkable. IMPRESSION: No active disease. Electronically Signed   By: 10/21/2014 MD   On: 12/21/2019 00:36  ____________________________________________   PROCEDURES  Procedure(s) performed (including Critical Care):  .1-3 Lead EKG Interpretation Performed by: Merwyn Katos, MD Authorized by: Merwyn Katos, MD     Interpretation: normal     ECG rate:  91   ECG rate assessment: normal     Rhythm: sinus rhythm     Ectopy: none     Conduction: normal       ____________________________________________   INITIAL IMPRESSION / ASSESSMENT AND PLAN / ED COURSE  As part of my medical decision making, I reviewed the following data within the electronic MEDICAL RECORD NUMBER Nursing notes reviewed and incorporated, Labs reviewed, EKG interpreted, Old chart reviewed, Radiograph reviewed and Notes from prior ED visits reviewed and incorporated        Patient is a 24 year old female who presents after a potential ingestion of unknown substance complaining of generalized weakness, palpitations, and found to be  tachycardic in triage.  Patient is alert and oriented x3.  Differential diagnosis includes unintentional opiate ingestion, methamphetamine use, cannabinoid intoxication, sepsis.  Urine drug screen only positive for cannabinoids.  Patient symptoms could be coming from acute cannabinoid intoxication.  Patient's tachycardia significantly throughout her emergency department course.  The patient has been reexamined and is ready to be discharged.  All diagnostic results have been reviewed and discussed with the patient/family.  Care plan has been outlined and the patient/family understands all current diagnoses, results, and treatment plans.  There are no new complaints, changes, or physical findings at this time.  All questions have been addressed and answered.  All medications, if any, that were given while in the emergency department or any that are being prescribed have been reviewed with the patient/family.  All side effects and adverse reactions have been explained.  Patient was instructed to, and agrees to follow-up with their primary care physician as well as return to the emergency department if any new or worsening symptoms develop.      ____________________________________________   FINAL CLINICAL IMPRESSION(S) / ED DIAGNOSES  Final diagnoses:  Generalized weakness  Dehydration     ED Discharge Orders    None       Note:  This document was prepared using Dragon voice recognition software and may include unintentional dictation errors.   Merwyn Katos, MD 12/21/19 [phone]

## 2019-12-21 NOTE — ED Notes (Signed)
ekg given to dr. Vicente Males

## 2019-12-21 NOTE — ED Triage Notes (Signed)
Pt presents to ed with children and mother. Pt states she feels weak, "doesn't feel good" and has been having chest pain. Pt is ambulatory. Pt denies nausea, known fever. Pt states she did have a headache earlier but has gon away. Pt denies cough.

## 2019-12-21 NOTE — ED Notes (Signed)
Pt endorsing that they were with "friends drinking alcohol." PT admits "I took a perc." PT endorsing that they believe something else was given to them without their knowledge.

## 2020-05-22 DIAGNOSIS — N760 Acute vaginitis: Secondary | ICD-10-CM | POA: Diagnosis not present

## 2020-11-14 DIAGNOSIS — H5213 Myopia, bilateral: Secondary | ICD-10-CM | POA: Diagnosis not present

## 2020-11-14 DIAGNOSIS — H52209 Unspecified astigmatism, unspecified eye: Secondary | ICD-10-CM | POA: Diagnosis not present

## 2020-11-22 DIAGNOSIS — F1120 Opioid dependence, uncomplicated: Secondary | ICD-10-CM | POA: Diagnosis not present

## 2020-11-22 DIAGNOSIS — R69 Illness, unspecified: Secondary | ICD-10-CM | POA: Diagnosis not present

## 2020-11-23 DIAGNOSIS — R69 Illness, unspecified: Secondary | ICD-10-CM | POA: Diagnosis not present

## 2020-11-23 DIAGNOSIS — F1120 Opioid dependence, uncomplicated: Secondary | ICD-10-CM | POA: Diagnosis not present

## 2020-11-24 DIAGNOSIS — R69 Illness, unspecified: Secondary | ICD-10-CM | POA: Diagnosis not present

## 2020-11-24 DIAGNOSIS — R5383 Other fatigue: Secondary | ICD-10-CM | POA: Diagnosis not present

## 2020-11-24 DIAGNOSIS — M7989 Other specified soft tissue disorders: Secondary | ICD-10-CM | POA: Diagnosis not present

## 2020-11-24 DIAGNOSIS — Z113 Encounter for screening for infections with a predominantly sexual mode of transmission: Secondary | ICD-10-CM | POA: Diagnosis not present

## 2020-11-24 DIAGNOSIS — R87612 Low grade squamous intraepithelial lesion on cytologic smear of cervix (LGSIL): Secondary | ICD-10-CM | POA: Diagnosis not present

## 2020-11-24 DIAGNOSIS — Z124 Encounter for screening for malignant neoplasm of cervix: Secondary | ICD-10-CM | POA: Diagnosis not present

## 2020-11-24 DIAGNOSIS — F1120 Opioid dependence, uncomplicated: Secondary | ICD-10-CM | POA: Diagnosis not present

## 2020-11-24 DIAGNOSIS — Z3009 Encounter for other general counseling and advice on contraception: Secondary | ICD-10-CM | POA: Diagnosis not present

## 2020-11-24 DIAGNOSIS — Z0000 Encounter for general adult medical examination without abnormal findings: Secondary | ICD-10-CM | POA: Diagnosis not present

## 2020-11-25 DIAGNOSIS — R69 Illness, unspecified: Secondary | ICD-10-CM | POA: Diagnosis not present

## 2020-11-25 DIAGNOSIS — F1120 Opioid dependence, uncomplicated: Secondary | ICD-10-CM | POA: Diagnosis not present

## 2020-11-26 DIAGNOSIS — R69 Illness, unspecified: Secondary | ICD-10-CM | POA: Diagnosis not present

## 2020-11-26 DIAGNOSIS — F1120 Opioid dependence, uncomplicated: Secondary | ICD-10-CM | POA: Diagnosis not present

## 2020-11-27 DIAGNOSIS — R69 Illness, unspecified: Secondary | ICD-10-CM | POA: Diagnosis not present

## 2020-11-28 DIAGNOSIS — R69 Illness, unspecified: Secondary | ICD-10-CM | POA: Diagnosis not present

## 2020-11-29 DIAGNOSIS — R69 Illness, unspecified: Secondary | ICD-10-CM | POA: Diagnosis not present

## 2020-11-30 DIAGNOSIS — R69 Illness, unspecified: Secondary | ICD-10-CM | POA: Diagnosis not present

## 2020-12-01 DIAGNOSIS — R69 Illness, unspecified: Secondary | ICD-10-CM | POA: Diagnosis not present

## 2020-12-02 DIAGNOSIS — R69 Illness, unspecified: Secondary | ICD-10-CM | POA: Diagnosis not present

## 2020-12-03 DIAGNOSIS — R69 Illness, unspecified: Secondary | ICD-10-CM | POA: Diagnosis not present

## 2020-12-04 DIAGNOSIS — R69 Illness, unspecified: Secondary | ICD-10-CM | POA: Diagnosis not present

## 2020-12-05 DIAGNOSIS — R69 Illness, unspecified: Secondary | ICD-10-CM | POA: Diagnosis not present

## 2020-12-06 DIAGNOSIS — R69 Illness, unspecified: Secondary | ICD-10-CM | POA: Diagnosis not present

## 2020-12-07 DIAGNOSIS — R69 Illness, unspecified: Secondary | ICD-10-CM | POA: Diagnosis not present

## 2020-12-08 DIAGNOSIS — R69 Illness, unspecified: Secondary | ICD-10-CM | POA: Diagnosis not present

## 2020-12-09 DIAGNOSIS — R69 Illness, unspecified: Secondary | ICD-10-CM | POA: Diagnosis not present

## 2020-12-12 DIAGNOSIS — R69 Illness, unspecified: Secondary | ICD-10-CM | POA: Diagnosis not present

## 2020-12-13 DIAGNOSIS — R69 Illness, unspecified: Secondary | ICD-10-CM | POA: Diagnosis not present

## 2020-12-14 DIAGNOSIS — R69 Illness, unspecified: Secondary | ICD-10-CM | POA: Diagnosis not present

## 2020-12-15 DIAGNOSIS — R69 Illness, unspecified: Secondary | ICD-10-CM | POA: Diagnosis not present

## 2020-12-16 DIAGNOSIS — R69 Illness, unspecified: Secondary | ICD-10-CM | POA: Diagnosis not present

## 2020-12-17 DIAGNOSIS — R69 Illness, unspecified: Secondary | ICD-10-CM | POA: Diagnosis not present

## 2020-12-18 DIAGNOSIS — R69 Illness, unspecified: Secondary | ICD-10-CM | POA: Diagnosis not present

## 2020-12-19 DIAGNOSIS — R69 Illness, unspecified: Secondary | ICD-10-CM | POA: Diagnosis not present

## 2020-12-20 DIAGNOSIS — R69 Illness, unspecified: Secondary | ICD-10-CM | POA: Diagnosis not present

## 2020-12-21 DIAGNOSIS — R69 Illness, unspecified: Secondary | ICD-10-CM | POA: Diagnosis not present

## 2020-12-22 DIAGNOSIS — R69 Illness, unspecified: Secondary | ICD-10-CM | POA: Diagnosis not present

## 2020-12-23 DIAGNOSIS — R69 Illness, unspecified: Secondary | ICD-10-CM | POA: Diagnosis not present

## 2020-12-24 DIAGNOSIS — R69 Illness, unspecified: Secondary | ICD-10-CM | POA: Diagnosis not present

## 2020-12-25 DIAGNOSIS — R69 Illness, unspecified: Secondary | ICD-10-CM | POA: Diagnosis not present

## 2020-12-26 DIAGNOSIS — R69 Illness, unspecified: Secondary | ICD-10-CM | POA: Diagnosis not present

## 2020-12-27 DIAGNOSIS — R69 Illness, unspecified: Secondary | ICD-10-CM | POA: Diagnosis not present

## 2020-12-28 DIAGNOSIS — R69 Illness, unspecified: Secondary | ICD-10-CM | POA: Diagnosis not present

## 2020-12-29 DIAGNOSIS — R69 Illness, unspecified: Secondary | ICD-10-CM | POA: Diagnosis not present

## 2020-12-30 DIAGNOSIS — R69 Illness, unspecified: Secondary | ICD-10-CM | POA: Diagnosis not present

## 2020-12-31 DIAGNOSIS — R69 Illness, unspecified: Secondary | ICD-10-CM | POA: Diagnosis not present

## 2021-01-01 DIAGNOSIS — R69 Illness, unspecified: Secondary | ICD-10-CM | POA: Diagnosis not present

## 2021-01-02 DIAGNOSIS — R69 Illness, unspecified: Secondary | ICD-10-CM | POA: Diagnosis not present

## 2021-01-03 DIAGNOSIS — R69 Illness, unspecified: Secondary | ICD-10-CM | POA: Diagnosis not present

## 2021-01-04 DIAGNOSIS — R69 Illness, unspecified: Secondary | ICD-10-CM | POA: Diagnosis not present

## 2021-01-05 DIAGNOSIS — R69 Illness, unspecified: Secondary | ICD-10-CM | POA: Diagnosis not present

## 2021-01-06 DIAGNOSIS — R69 Illness, unspecified: Secondary | ICD-10-CM | POA: Diagnosis not present

## 2021-01-07 DIAGNOSIS — R69 Illness, unspecified: Secondary | ICD-10-CM | POA: Diagnosis not present

## 2021-01-08 DIAGNOSIS — R69 Illness, unspecified: Secondary | ICD-10-CM | POA: Diagnosis not present

## 2021-01-09 DIAGNOSIS — R69 Illness, unspecified: Secondary | ICD-10-CM | POA: Diagnosis not present

## 2021-01-10 DIAGNOSIS — R69 Illness, unspecified: Secondary | ICD-10-CM | POA: Diagnosis not present

## 2021-01-11 DIAGNOSIS — R69 Illness, unspecified: Secondary | ICD-10-CM | POA: Diagnosis not present

## 2021-01-12 DIAGNOSIS — R69 Illness, unspecified: Secondary | ICD-10-CM | POA: Diagnosis not present

## 2021-01-13 DIAGNOSIS — R69 Illness, unspecified: Secondary | ICD-10-CM | POA: Diagnosis not present

## 2021-01-14 DIAGNOSIS — R69 Illness, unspecified: Secondary | ICD-10-CM | POA: Diagnosis not present

## 2021-01-15 DIAGNOSIS — R69 Illness, unspecified: Secondary | ICD-10-CM | POA: Diagnosis not present

## 2021-01-16 DIAGNOSIS — R69 Illness, unspecified: Secondary | ICD-10-CM | POA: Diagnosis not present

## 2021-01-17 DIAGNOSIS — R69 Illness, unspecified: Secondary | ICD-10-CM | POA: Diagnosis not present

## 2021-01-18 DIAGNOSIS — R69 Illness, unspecified: Secondary | ICD-10-CM | POA: Diagnosis not present

## 2021-01-19 DIAGNOSIS — R69 Illness, unspecified: Secondary | ICD-10-CM | POA: Diagnosis not present

## 2021-01-20 DIAGNOSIS — R69 Illness, unspecified: Secondary | ICD-10-CM | POA: Diagnosis not present

## 2021-01-21 DIAGNOSIS — R69 Illness, unspecified: Secondary | ICD-10-CM | POA: Diagnosis not present

## 2021-01-22 DIAGNOSIS — R69 Illness, unspecified: Secondary | ICD-10-CM | POA: Diagnosis not present

## 2021-01-23 DIAGNOSIS — R69 Illness, unspecified: Secondary | ICD-10-CM | POA: Diagnosis not present

## 2021-01-24 DIAGNOSIS — R69 Illness, unspecified: Secondary | ICD-10-CM | POA: Diagnosis not present

## 2021-01-25 DIAGNOSIS — R69 Illness, unspecified: Secondary | ICD-10-CM | POA: Diagnosis not present

## 2021-01-26 DIAGNOSIS — R69 Illness, unspecified: Secondary | ICD-10-CM | POA: Diagnosis not present

## 2021-01-27 DIAGNOSIS — R69 Illness, unspecified: Secondary | ICD-10-CM | POA: Diagnosis not present

## 2021-01-28 DIAGNOSIS — R69 Illness, unspecified: Secondary | ICD-10-CM | POA: Diagnosis not present

## 2021-01-29 DIAGNOSIS — R69 Illness, unspecified: Secondary | ICD-10-CM | POA: Diagnosis not present

## 2021-01-31 DIAGNOSIS — R69 Illness, unspecified: Secondary | ICD-10-CM | POA: Diagnosis not present

## 2021-02-01 DIAGNOSIS — R69 Illness, unspecified: Secondary | ICD-10-CM | POA: Diagnosis not present

## 2021-02-02 DIAGNOSIS — R69 Illness, unspecified: Secondary | ICD-10-CM | POA: Diagnosis not present

## 2021-02-03 DIAGNOSIS — R69 Illness, unspecified: Secondary | ICD-10-CM | POA: Diagnosis not present

## 2021-02-04 DIAGNOSIS — R69 Illness, unspecified: Secondary | ICD-10-CM | POA: Diagnosis not present

## 2021-02-05 DIAGNOSIS — R69 Illness, unspecified: Secondary | ICD-10-CM | POA: Diagnosis not present

## 2021-02-06 DIAGNOSIS — R69 Illness, unspecified: Secondary | ICD-10-CM | POA: Diagnosis not present

## 2021-02-07 DIAGNOSIS — R69 Illness, unspecified: Secondary | ICD-10-CM | POA: Diagnosis not present

## 2021-02-08 DIAGNOSIS — R69 Illness, unspecified: Secondary | ICD-10-CM | POA: Diagnosis not present

## 2021-02-09 DIAGNOSIS — R69 Illness, unspecified: Secondary | ICD-10-CM | POA: Diagnosis not present

## 2021-02-10 DIAGNOSIS — R69 Illness, unspecified: Secondary | ICD-10-CM | POA: Diagnosis not present

## 2021-02-11 DIAGNOSIS — R69 Illness, unspecified: Secondary | ICD-10-CM | POA: Diagnosis not present

## 2021-02-12 DIAGNOSIS — R69 Illness, unspecified: Secondary | ICD-10-CM | POA: Diagnosis not present

## 2021-02-13 DIAGNOSIS — R69 Illness, unspecified: Secondary | ICD-10-CM | POA: Diagnosis not present

## 2021-02-14 DIAGNOSIS — R69 Illness, unspecified: Secondary | ICD-10-CM | POA: Diagnosis not present

## 2021-02-15 DIAGNOSIS — R69 Illness, unspecified: Secondary | ICD-10-CM | POA: Diagnosis not present

## 2021-02-16 DIAGNOSIS — R69 Illness, unspecified: Secondary | ICD-10-CM | POA: Diagnosis not present

## 2021-02-17 DIAGNOSIS — R69 Illness, unspecified: Secondary | ICD-10-CM | POA: Diagnosis not present

## 2021-02-18 DIAGNOSIS — R69 Illness, unspecified: Secondary | ICD-10-CM | POA: Diagnosis not present

## 2023-11-14 ENCOUNTER — Other Ambulatory Visit: Payer: Self-pay

## 2023-11-14 ENCOUNTER — Encounter: Payer: Self-pay | Admitting: Emergency Medicine

## 2023-11-14 DIAGNOSIS — D509 Iron deficiency anemia, unspecified: Secondary | ICD-10-CM | POA: Diagnosis present

## 2023-11-14 DIAGNOSIS — O99282 Endocrine, nutritional and metabolic diseases complicating pregnancy, second trimester: Secondary | ICD-10-CM | POA: Diagnosis present

## 2023-11-14 DIAGNOSIS — Z87440 Personal history of urinary (tract) infections: Secondary | ICD-10-CM

## 2023-11-14 DIAGNOSIS — J4520 Mild intermittent asthma, uncomplicated: Secondary | ICD-10-CM | POA: Diagnosis present

## 2023-11-14 DIAGNOSIS — N136 Pyonephrosis: Secondary | ICD-10-CM | POA: Diagnosis present

## 2023-11-14 DIAGNOSIS — O99512 Diseases of the respiratory system complicating pregnancy, second trimester: Secondary | ICD-10-CM | POA: Diagnosis present

## 2023-11-14 DIAGNOSIS — Z3A15 15 weeks gestation of pregnancy: Secondary | ICD-10-CM

## 2023-11-14 DIAGNOSIS — E876 Hypokalemia: Secondary | ICD-10-CM | POA: Diagnosis present

## 2023-11-14 DIAGNOSIS — Z86718 Personal history of other venous thrombosis and embolism: Secondary | ICD-10-CM

## 2023-11-14 DIAGNOSIS — O219 Vomiting of pregnancy, unspecified: Secondary | ICD-10-CM | POA: Diagnosis present

## 2023-11-14 DIAGNOSIS — Z888 Allergy status to other drugs, medicaments and biological substances status: Secondary | ICD-10-CM

## 2023-11-14 DIAGNOSIS — Z87891 Personal history of nicotine dependence: Secondary | ICD-10-CM

## 2023-11-14 DIAGNOSIS — Z79899 Other long term (current) drug therapy: Secondary | ICD-10-CM

## 2023-11-14 DIAGNOSIS — Z87442 Personal history of urinary calculi: Secondary | ICD-10-CM

## 2023-11-14 DIAGNOSIS — Z833 Family history of diabetes mellitus: Secondary | ICD-10-CM

## 2023-11-14 DIAGNOSIS — O2302 Infections of kidney in pregnancy, second trimester: Principal | ICD-10-CM | POA: Diagnosis present

## 2023-11-14 LAB — URINALYSIS, ROUTINE W REFLEX MICROSCOPIC
Bilirubin Urine: NEGATIVE
Glucose, UA: NEGATIVE mg/dL
Ketones, ur: 5 mg/dL — AB
Nitrite: POSITIVE — AB
Protein, ur: NEGATIVE mg/dL
Specific Gravity, Urine: 1.015 (ref 1.005–1.030)
WBC, UA: >50 WBC/hpf (ref 0–5)
pH: 6 (ref 5.0–8.0)

## 2023-11-14 LAB — CBC
HCT: 32.1 % — ABNORMAL LOW (ref 36.0–46.0)
Hemoglobin: 10.1 g/dL — ABNORMAL LOW (ref 12.0–15.0)
MCH: 24.3 pg — ABNORMAL LOW (ref 26.0–34.0)
MCHC: 31.5 g/dL (ref 30.0–36.0)
MCV: 77.2 fL — ABNORMAL LOW (ref 80.0–100.0)
Platelets: 297 K/uL (ref 150–400)
RBC: 4.16 MIL/uL (ref 3.87–5.11)
RDW: 12.4 % (ref 11.5–15.5)
WBC: 10.7 K/uL — ABNORMAL HIGH (ref 4.0–10.5)
nRBC: 0 % (ref 0.0–0.2)

## 2023-11-14 LAB — COMPREHENSIVE METABOLIC PANEL WITH GFR
ALT: 9 U/L (ref 0–44)
AST: 15 U/L (ref 15–41)
Albumin: 3.5 g/dL (ref 3.5–5.0)
Alkaline Phosphatase: 35 U/L — ABNORMAL LOW (ref 38–126)
Anion gap: 12 (ref 5–15)
BUN: 5 mg/dL — ABNORMAL LOW (ref 6–20)
CO2: 21 mmol/L — ABNORMAL LOW (ref 22–32)
Calcium: 9 mg/dL (ref 8.9–10.3)
Chloride: 102 mmol/L (ref 98–111)
Creatinine, Ser: 0.73 mg/dL (ref 0.44–1.00)
GFR, Estimated: >60 mL/min (ref >60)
Glucose, Bld: 110 mg/dL — ABNORMAL HIGH (ref 70–99)
Potassium: 3.1 mmol/L — ABNORMAL LOW (ref 3.5–5.1)
Sodium: 135 mmol/L (ref 135–145)
Total Bilirubin: 0.5 mg/dL (ref 0.0–1.2)
Total Protein: 7.3 g/dL (ref 6.5–8.1)

## 2023-11-14 LAB — POC URINE PREG, ED: Preg Test, Ur: POSITIVE — AB

## 2023-11-14 LAB — HCG, QUANTITATIVE, PREGNANCY: hCG, Beta Chain, Quant, S: 23481 m[IU]/mL — ABNORMAL HIGH (ref <5)

## 2023-11-14 LAB — ABO/RH: ABO/RH(D): B POS

## 2023-11-14 NOTE — ED Triage Notes (Signed)
 Pt presents to the ED via POV with complaints of lower abdominal pain with radiation to her back that started 2 days ago. She notes some urinary urgency and is ~[redacted] weeks pregnant and has not had any prenatal care. A&Ox4 at this time. Denies fevers, chills, CP or SOB.

## 2023-11-15 ENCOUNTER — Emergency Department: Payer: MEDICAID

## 2023-11-15 ENCOUNTER — Encounter: Payer: Self-pay | Admitting: Internal Medicine

## 2023-11-15 ENCOUNTER — Observation Stay: Payer: MEDICAID

## 2023-11-15 ENCOUNTER — Inpatient Hospital Stay
Admission: EM | Admit: 2023-11-15 | Discharge: 2023-11-18 | DRG: 832 | Disposition: A | Payer: MEDICAID | Attending: Internal Medicine | Admitting: Internal Medicine

## 2023-11-15 DIAGNOSIS — M545 Low back pain, unspecified: Secondary | ICD-10-CM

## 2023-11-15 DIAGNOSIS — J45909 Unspecified asthma, uncomplicated: Secondary | ICD-10-CM | POA: Diagnosis present

## 2023-11-15 DIAGNOSIS — N10 Acute tubulo-interstitial nephritis: Secondary | ICD-10-CM | POA: Diagnosis not present

## 2023-11-15 DIAGNOSIS — R109 Unspecified abdominal pain: Secondary | ICD-10-CM

## 2023-11-15 DIAGNOSIS — Z87440 Personal history of urinary (tract) infections: Secondary | ICD-10-CM | POA: Diagnosis present

## 2023-11-15 DIAGNOSIS — N1330 Unspecified hydronephrosis: Secondary | ICD-10-CM

## 2023-11-15 DIAGNOSIS — Z3490 Encounter for supervision of normal pregnancy, unspecified, unspecified trimester: Secondary | ICD-10-CM

## 2023-11-15 DIAGNOSIS — R300 Dysuria: Secondary | ICD-10-CM

## 2023-11-15 DIAGNOSIS — Z86718 Personal history of other venous thrombosis and embolism: Secondary | ICD-10-CM

## 2023-11-15 DIAGNOSIS — N12 Tubulo-interstitial nephritis, not specified as acute or chronic: Principal | ICD-10-CM

## 2023-11-15 LAB — LIPASE, BLOOD: Lipase: 16 U/L (ref 11–51)

## 2023-11-15 LAB — HEPATITIS B SURFACE ANTIGEN: Hepatitis B Surface Ag: NONREACTIVE

## 2023-11-15 LAB — HEMOGLOBIN A1C
Hgb A1c MFr Bld: 4.6 % — ABNORMAL LOW (ref 4.8–5.6)
Mean Plasma Glucose: 85.32 mg/dL

## 2023-11-15 LAB — LACTIC ACID, PLASMA
Lactic Acid, Venous: 0.5 mmol/L (ref 0.5–1.9)
Lactic Acid, Venous: 1 mmol/L (ref 0.5–1.9)

## 2023-11-15 LAB — TSH: TSH: 1.219 u[IU]/mL (ref 0.350–4.500)

## 2023-11-15 MED ORDER — ONDANSETRON HCL 4 MG/2ML IJ SOLN
4.0000 mg | Freq: Once | INTRAMUSCULAR | Status: AC
  Administered 2023-11-15: 4 mg via INTRAVENOUS
  Filled 2023-11-15: qty 2

## 2023-11-15 MED ORDER — SODIUM CHLORIDE 0.9 % IV SOLN: INTRAVENOUS | Status: AC

## 2023-11-15 MED ORDER — ONDANSETRON HCL 4 MG/2ML IJ SOLN: 4.0000 mg | Freq: Three times a day (TID) | INTRAMUSCULAR | Status: AC | PRN

## 2023-11-15 MED ORDER — SODIUM CHLORIDE 0.9 % IV SOLN
1.0000 g | INTRAVENOUS | Status: DC
  Administered 2023-11-16 – 2023-11-18 (×3): 1 g via INTRAVENOUS
  Filled 2023-11-15 (×3): qty 10

## 2023-11-15 MED ORDER — POTASSIUM CHLORIDE CRYS ER 20 MEQ PO TBCR
20.0000 meq | EXTENDED_RELEASE_TABLET | ORAL | Status: AC
  Administered 2023-11-15 (×2): 20 meq via ORAL
  Filled 2023-11-15 (×2): qty 1

## 2023-11-15 MED ORDER — ACETAMINOPHEN 500 MG PO TABS
1000.0000 mg | ORAL_TABLET | Freq: Once | ORAL | Status: AC
  Administered 2023-11-15: 1000 mg via ORAL
  Filled 2023-11-15: qty 2

## 2023-11-15 MED ORDER — SODIUM CHLORIDE 0.9 % IV BOLUS
1000.0000 mL | Freq: Once | INTRAVENOUS | Status: AC
  Administered 2023-11-15: 1000 mL via INTRAVENOUS

## 2023-11-15 MED ORDER — SODIUM CHLORIDE 0.9 % IV SOLN
12.5000 mg | Freq: Four times a day (QID) | INTRAVENOUS | Status: DC | PRN
  Administered 2023-11-16: 12.5 mg via INTRAVENOUS
  Filled 2023-11-15: qty 0.5
  Filled 2023-11-15: qty 12.5

## 2023-11-15 MED ORDER — PANTOPRAZOLE SODIUM 40 MG IV SOLR
40.0000 mg | Freq: Two times a day (BID) | INTRAVENOUS | Status: AC
  Administered 2023-11-15 – 2023-11-16 (×4): 40 mg via INTRAVENOUS
  Filled 2023-11-15 (×4): qty 10

## 2023-11-15 MED ORDER — HYDROMORPHONE HCL 1 MG/ML IJ SOLN
1.0000 mg | INTRAMUSCULAR | Status: DC | PRN
  Administered 2023-11-15 (×4): 1 mg via INTRAVENOUS
  Filled 2023-11-15 (×4): qty 1

## 2023-11-15 MED ORDER — ORAL CARE MOUTH RINSE: 15.0000 mL | OROMUCOSAL | Status: DC | PRN

## 2023-11-15 MED ORDER — MORPHINE SULFATE (PF) 2 MG/ML IV SOLN
2.0000 mg | Freq: Once | INTRAVENOUS | Status: DC
  Filled 2023-11-15: qty 1

## 2023-11-15 MED ORDER — HEPARIN SODIUM (PORCINE) 5000 UNIT/ML IJ SOLN
5000.0000 [IU] | Freq: Three times a day (TID) | INTRAMUSCULAR | Status: DC
  Administered 2023-11-15 – 2023-11-18 (×9): 5000 [IU] via SUBCUTANEOUS
  Filled 2023-11-15 (×9): qty 1

## 2023-11-15 MED ORDER — FENTANYL CITRATE PF 50 MCG/ML IJ SOSY
25.0000 ug | PREFILLED_SYRINGE | Freq: Once | INTRAMUSCULAR | Status: AC
  Administered 2023-11-15: 25 ug via INTRAVENOUS
  Filled 2023-11-15: qty 1

## 2023-11-15 MED ORDER — SODIUM CHLORIDE 0.9 % IV SOLN
1.0000 g | Freq: Once | INTRAVENOUS | Status: AC
  Administered 2023-11-15: 1 g via INTRAVENOUS
  Filled 2023-11-15: qty 10

## 2023-11-15 MED ORDER — HYDROMORPHONE HCL 1 MG/ML IJ SOLN
1.0000 mg | INTRAMUSCULAR | Status: AC | PRN
  Administered 2023-11-15 – 2023-11-16 (×6): 1 mg via INTRAVENOUS
  Filled 2023-11-15 (×6): qty 1

## 2023-11-15 MED ORDER — PRENATAL MULTIVITAMIN CH
1.0000 | ORAL_TABLET | Freq: Every day | ORAL | Status: DC
  Administered 2023-11-16 – 2023-11-17 (×2): 1 via ORAL
  Filled 2023-11-15 (×3): qty 1

## 2023-11-15 NOTE — H&P (Addendum)
 History and Physical    Kathryn Johnston FMW:[id number] DOB: December 14, 1995 DOA: 11/15/2023  DOS: the patient was seen and examined on 11/15/2023  PCP: Patient, No Pcp Per   Patient coming from: Home  I have personally briefly reviewed patient's old medical records in [facility]  Chief Complaint: Right flank pain  HPI: Kathryn Johnston is a pleasant 28 y.o. female with medical history significant for asthma, history of lower extremity DVT 10 years ago completed anticoagulation, mild intermittent, history of nephrolithiasis, frequent UTIs, 15 weeks pregnancy came into ED complaining of abdominal pain mostly around right flank and right back associated with some dysuria and some nausea no vomiting.  Patient stated that her pain is 7/10 in intensity, cramping and getting worse with movement.  She had similar symptoms in 2021 and did not show any nephrolithiasis or hydronephrosis.  She had prior cultures which grew Enterobacter and E. coli and they were sensitive to ceftriaxone .  ED Course: Upon arrival to the ED, patient is found to have acute pyelonephritis.  Started on antibiotic ceftriaxone , potassium replacement, nausea medication with Phenergan, ondansetron , Dilaudid  and fentanyl .  Blood cultures were sent.  Hospitalist service was consulted for evaluation for admission.  Review of Systems:  ROS  All other systems negative except as noted in the HPI.  Past Medical History:  Diagnosis Date   Anemia    Asthma    DVT (deep venous thrombosis) (HCC)    high- risk in 1st pregnancy- on lovenox both pregnancy- 2nd pregnancy only used lovenox for 6 weeks- pt did not like it   H/O trichomoniasis    2013   Pyelonephritis    10/2014, with sepsis, hospitalized at [facility]    Past Surgical History:  Procedure Laterality Date   NO PAST SURGERIES       reports that she quit smoking about 8 years ago. Her smoking use included cigarettes. She has never used smokeless tobacco. She reports that she does  not drink alcohol and does not use drugs.  Allergies  Allergen Reactions   Ibuprofen Hives    Family History  Problem Relation Age of Onset   Diabetes Mother    Diabetes Maternal Grandmother    Cancer Neg Hx    Heart disease Neg Hx     Prior to Admission medications   Medication Sig Start Date End Date Taking? Authorizing Provider  cefdinir  (OMNICEF ) 300 MG capsule Take 1 capsule (300 mg total) by mouth 2 (two) times daily. 08/31/19   Gasper Devere ORN, PA-C  ferrous sulfate  325 (65 FE) MG tablet Take 1 tablet (325 mg total) by mouth 2 (two) times daily with a meal. 07/11/15   Connell Davies, MD  ondansetron  (ZOFRAN ) 4 MG tablet Take 1 tablet (4 mg total) by mouth every 8 (eight) hours as needed for nausea or vomiting. 05/06/16   Brain Redell RAMAN, MD  Prenat-FeFum-FePo-FA-Omega 3 (CONCEPT DHA ) 53.5-38-1 MG CAPS Take 53.5 mg by mouth daily. 07/08/15   Defrancesco, Gladis LABOR, MD  traMADol  (ULTRAM ) 50 MG tablet Take 1 tablet (50 mg total) by mouth every 6 (six) hours as needed. 05/06/16   Brain Redell RAMAN, MD    Physical Exam: Vitals:   11/15/23 0315 11/15/23 0330 11/15/23 0553 11/15/23 0634  BP: 102/68 93/61  107/66  Pulse: 83 73  64  Resp:    18  Temp:   98.2 F (36.8 C) 98.3 F (36.8 C)  TempSrc:   Oral Oral  SpO2: 98% 97%  100%  Weight:  59.1 kg  Height:    5' 2 (1.575 m)    Physical Exam   Constitutional: Alert, awake, calm, comfortable HEENT: Neck supple Respiratory: Clear to auscultation B/L, no wheezing, no rales.  Cardiovascular: Regular rate and rhythm, no murmurs / rubs / gallops. No extremity edema. 2+ pedal pulses. No carotid bruits.  Abdomen: Soft, right flank tender, Bowel sounds positive.  Right costovertebral angle tenderness present Musculoskeletal: no clubbing / cyanosis. Good ROM, no contractures. Normal muscle tone.  Skin: no rashes, lesions, ulcers. Neurologic: CN 2-12 grossly intact. Sensation intact, No focal deficit identified Psychiatric: Alert and  oriented x 3. Normal mood.    Labs on Admission: I have personally reviewed following labs and imaging studies  CBC: Recent Labs  Lab 11/14/23 2242  WBC 10.7*  HGB 10.1*  HCT 32.1*  MCV 77.2*  PLT 297   Basic Metabolic Panel: Recent Labs  Lab 11/14/23 2242  NA 135  K 3.1*  CL 102  CO2 21*  GLUCOSE 110*  BUN 5*  CREATININE 0.73  CALCIUM 9.0   GFR: Estimated Creatinine Clearance: 82.1 mL/min (by C-G formula based on SCr of 0.73 mg/dL). Liver Function Tests: Recent Labs  Lab 11/14/23 2242  AST 15  ALT 9  ALKPHOS 35*  BILITOT 0.5  PROT 7.3  ALBUMIN 3.5   Recent Labs  Lab 11/14/23 2242  LIPASE 16   No results for input(s): AMMONIA in the last 168 hours. Coagulation Profile: No results for input(s): INR, PROTIME in the last 168 hours. Cardiac Enzymes: No results for input(s): CKTOTAL, CKMB, CKMBINDEX, TROPONINI, TROPONINIHS in the last 168 hours. BNP (last 3 results) No results for input(s): BNP in the last 8760 hours. HbA1C: No results for input(s): HGBA1C in the last 72 hours. CBG: No results for input(s): GLUCAP in the last 168 hours. Lipid Profile: No results for input(s): CHOL, HDL, LDLCALC, TRIG, CHOLHDL, LDLDIRECT in the last 72 hours. Thyroid  Function Tests: No results for input(s): TSH, T4TOTAL, FREET4, T3FREE, THYROIDAB in the last 72 hours. Anemia Panel: No results for input(s): VITAMINB12, FOLATE, FERRITIN, TIBC, IRON, RETICCTPCT in the last 72 hours. Urine analysis:    Component Value Date/Time   COLORURINE YELLOW (A) 11/14/2023 2242   APPEARANCEUR CLOUDY (A) 11/14/2023 2242   APPEARANCEUR Clear 07/08/2015 1437   LABSPEC 1.015 11/14/2023 2242   PHURINE 6.0 11/14/2023 2242   GLUCOSEU NEGATIVE 11/14/2023 2242   HGBUR SMALL (A) 11/14/2023 2242   BILIRUBINUR NEGATIVE 11/14/2023 2242   BILIRUBINUR neg 08/09/2015 1528   BILIRUBINUR Negative 07/08/2015 1437   KETONESUR 5 (A) 11/14/2023  2242   PROTEINUR NEGATIVE 11/14/2023 2242   UROBILINOGEN negative 08/09/2015 1528   NITRITE POSITIVE (A) 11/14/2023 2242   LEUKOCYTESUR LARGE (A) 11/14/2023 2242    Radiological Exams on Admission: I have personally reviewed images US  Renal Result Date: 11/15/2023 EXAM: US  Retroperitoneum Complete, Renal. CLINICAL HISTORY: R flank and back pain, history of kidney stones. TECHNIQUE: Real-time ultrasound of the retroperitoneum (complete) with image documentation. COMPARISON: None provided. FINDINGS: RIGHT KIDNEY: The right kidney measures 10.1 x 4.5 x 6.0 cm with an estimated volume of 144 cc. Renal cortical thickness and echogenicity is within normal limits. Mild hydronephrosis. No intrarenal mass or calcification. LEFT KIDNEY: The left kidney measures 11.1 x 5.5 x 5.5 cm with an estimated volume of 176 cc. Renal cortical thickness and echogenicity is within normal limits. No hydronephrosis. No intrarenal mass or calcification. BLADDER: Unremarkable as visualized. IMPRESSION: 1. Mild right hydronephrosis. No intrarenal mass or  calcification. Electronically signed by: Dorethia Molt MD 11/15/2023 02:37 AM EDT RP Workstation: [id number]   US  OB Limited Result Date: 11/15/2023 CLINICAL DATA:  Lower abdominal pain EXAM: LIMITED OBSTETRIC ULTRASOUND COMPARISON:  None Available. FINDINGS: Number of Fetuses: 1 Heart Rate:  155 bpm Movement: Yes Presentation: Variable Placental Location: Fundal, posterior Previa: No Amniotic Fluid (Subjective):  Within normal limits. AFI:  cm BPD: 3.1 cm 15 w  5 d MATERNAL FINDINGS: Cervix:  Appears closed. Uterus/Adnexae: No abnormality visualized. IMPRESSION: Approximately 15 week 5 day intrauterine pregnancy. Fetal heart rate 155 beats per minute. No acute maternal findings. This exam is performed on an emergent basis and does not comprehensively evaluate fetal size, dating, or anatomy; follow-up complete OB US  should be considered if further fetal assessment is warranted.  Electronically Signed   By: Franky Crease M.D.   On: 11/15/2023 01:08    EKG: NA    Assessment/Plan Principal Problem:   Acute pyelonephritis Active Problems:   Personal history of urinary infection   H/O deep venous thrombosis   Asthma    Assessment and Plan: 28 year old female with history of recurrent urinary tract infections in the past, DVT 10 years ago completed anticoagulation, mild intermittent asthma, 15 weeks pregnancy came into ED complaining of right flank pain.  1.  Acute right-sided pyelonephritis - She will be continued on ceftriaxone . - She received Dilaudid  and fentanyl  in the emergency room.  That did not help and they added Dilaudid . - I will continue Dilaudid  for now - Will start her on normal saline, antinausea medication with Zofran . - She will be also given Protonix  due to nausea. - She was planned for MRI of the abdomen due to concern for hydronephrosis and stone. - That will be done in the afternoon if that is okay from OB/GYN team - Follow the cultures  2.  15 weeks pregnancy - OB/GYN has been consulted - Will follow the recommendation  3.  Mild intermittent asthma - I will place her for nebulization as needed  4.  Hypokalemia - Replaced - Will check potassium level    DVT prophylaxis: SQ Heparin  Code Status: Full Code Family Communication: None  Disposition Plan: Home  Consults called: Ob/Gyne  Admission status: Observation, Med-Surg   Nena Rebel, MD [facility] 11/15/2023, 8:53 AM

## 2023-11-15 NOTE — TOC CM/SW Note (Signed)
 Transition of Care [facility]) - Inpatient Brief Assessment   Patient Details  Name: Makenzie Weisner MRN: [id number] Date of Birth: [birth year]  Transition of Care [facility] CM/SW Contact:    Alfonso Rummer, LCSW Phone Number: 11/15/2023, 9:52 AM   Clinical Narrative:  KEN DELENA Rummer completed TOC chart review. No TOC needs identified please contact should needs arise.   Transition of Care Asessment:

## 2023-11-15 NOTE — Consult Note (Signed)
 Consult History and Physical   SERVICE: OB/GYN  Patient Name: Kathryn Johnston Patient MRN:   [id number]  HPI: Kathryn Johnston is a 28 y.o. [phone] at [redacted]w[redacted]d by LMP present to with abdominal pain, right flank pain, right back pain, dysuria and some N/V.  ED started treatment for acute pyelonephritis, managed pain with IV pain medication and ordered an MRI.  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  The radiologist spoke with Dr. IVAR Dinsmore who asked me to coordinate a consult with MFM.  Spoke with Dr. Kizzie who requested that she follow up outpatient for an ultrasound.  Will plan for NSTs daily.  Significant MHx: Asthma H/o LE DVT, 10 years ago H/o nephrolithiasis H/o frequent UTIs   Past Obstetrical History: OB History     Gravida  5   Para  3   Term  3   Preterm  0   AB  1   Living  3      SAB  1   IAB      Ectopic      Multiple      Live Births  3           Past Gynecologic History: Patient's last menstrual period was 07/21/2023 (approximate).   LMP = [redacted]w[redacted]d 15 week u/s = [redacted]w[redacted]d  Past Medical History: Past Medical History:  Diagnosis Date   Anemia    Asthma    DVT (deep venous thrombosis) (HCC)    high- risk in 1st pregnancy- on lovenox both pregnancy- 2nd pregnancy only used lovenox for 6 weeks- pt did not like it   H/O trichomoniasis    2013   Pyelonephritis    10/2014, with sepsis, hospitalized at [facility]    Past Surgical History:   Past Surgical History:  Procedure Laterality Date   NO PAST SURGERIES      Family History:  family history includes Diabetes in her maternal grandmother and mother.  Social History:  Social History   Socioeconomic History   Marital status: Single    Spouse name: Not on file   Number of children: Not on file   Years of education: Not on file   Highest education level: Not on file  Occupational History   Not on file  Tobacco Use   Smoking status: Former    Current packs/day: 0.00    Types: Cigarettes     Quit date: 04/20/2015    Years since quitting: 8.5   Smokeless tobacco: Never  Vaping Use   Vaping status: Never Used  Substance and Sexual Activity   Alcohol use: No   Drug use: No   Sexual activity: Yes    Birth control/protection: None  Other Topics Concern   Not on file  Social History Narrative   Not on file   Social Drivers of Health   Financial Resource Strain: Low Risk  (02/02/2023)   Received from [facility]   Overall Financial Resource Strain (CARDIA)    Difficulty of Paying Living Expenses: Not very hard  Food Insecurity: No Food Insecurity (09/25/2023)   Received from [facility]   Hunger Vital Sign    Within the past 12 months, you worried that your food would run out before you got the money to buy more.: Never true    Within the past 12 months, the food you bought just didn't last and you didn't have money to get more.: Never true  Transportation Needs: No Transportation Needs (09/25/2023)   Received  from [facility]   PRAPARE - Transportation    Lack of Transportation (Medical): No    Lack of Transportation (Non-Medical): No  Physical Activity: Not on file  Stress: Not on file  Social Connections: Not on file  Intimate Partner Violence: Not on file    Home Medications:  Medications reconciled in EPIC  No current facility-administered medications on file prior to encounter.   Current Outpatient Medications on File Prior to Encounter  Medication Sig Dispense Refill   cefdinir  (OMNICEF ) 300 MG capsule Take 1 capsule (300 mg total) by mouth 2 (two) times daily. 20 capsule 0   ferrous sulfate  325 (65 FE) MG tablet Take 1 tablet (325 mg total) by mouth 2 (two) times daily with a meal. 60 tablet 3   ondansetron  (ZOFRAN ) 4 MG tablet Take 1 tablet (4 mg total) by mouth every 8 (eight) hours as needed for nausea or vomiting. 21 tablet 0   Prenat-FeFum-FePo-FA-Omega 3 (CONCEPT DHA ) 53.5-38-1 MG CAPS Take 53.5 mg by mouth daily. 90 capsule 4    traMADol  (ULTRAM ) 50 MG tablet Take 1 tablet (50 mg total) by mouth every 6 (six) hours as needed. 12 tablet 0    Allergies:  Allergies  Allergen Reactions   Ibuprofen Hives    Physical Exam:  Temp:  [98 F (36.7 C)-98.3 F (36.8 C)] 98 F (36.7 C) (09/26 1509) Pulse Rate:  [64-102] 81 (09/26 1509) Resp:  [16-20] 16 (09/26 1509) BP: (93-112)/(61-70) 102/64 (09/26 1509) SpO2:  [97 %-100 %] 100 % (09/26 1509) Weight:  [59.1 kg-68 kg] 59.1 kg (09/26 0634)   Labs/Studies:   CBC and Coags:  Lab Results  Component Value Date   WBC 10.7 (H) 11/14/2023   NEUTOPHILPCT 63 08/31/2019   EOSPCT 0 08/31/2019   BASOPCT 0 08/31/2019   LYMPHOPCT 28 08/31/2019   HGB 10.1 (L) 11/14/2023   HCT 32.1 (L) 11/14/2023   MCV 77.2 (L) 11/14/2023   PLT 297 11/14/2023   CMP:  Lab Results  Component Value Date   NA 135 11/14/2023   K 3.1 (L) 11/14/2023   CL 102 11/14/2023   CO2 21 (L) 11/14/2023   BUN 5 (L) 11/14/2023   CREATININE 0.73 11/14/2023   CREATININE 0.81 12/21/2019   CREATININE 0.92 08/31/2019   PROT 7.3 11/14/2023   BILITOT 0.5 11/14/2023   ALT 9 11/14/2023   AST 15 11/14/2023   ALKPHOS 35 (L) 11/14/2023    Other Imaging: MR ABDOMEN WO CONTRAST Addendum Date: 11/15/2023 ADDENDUM REPORT: 11/15/2023 10:43 ADDENDUM: Critical Value/emergent results were called by telephone at the time of interpretation on 11/15/2023 at 10:42 am to Dr. Debby Dinsmore and Dr. Roann, Who verbally acknowledged these results. Electronically Signed   By: Ree Molt M.D.   On: 11/15/2023 10:43   Result Date: 11/15/2023 CLINICAL DATA:  Pregnant, ultrasound showed mild hydronephrosis, history of nephrolithiasis with flank pain, right back pain and abdominal pain. EXAM: MRI ABDOMEN AND PELVIS WITHOUT CONTRAST TECHNIQUE: Multiplanar multisequence MR imaging of the abdomen and pelvis was performed. No intravenous contrast was administered. COMPARISON:  Renal ultrasound from earlier the same day and  CT scan renal stone protocol from 08/31/2019. FINDINGS: COMBINED FINDINGS FOR BOTH MR ABDOMEN AND PELVIS Lower chest: Unremarkable MR appearance to the lung bases. No pleural effusion. No pericardial effusion. Normal heart size. Hepatobiliary: Liver is normal in size. Noncirrhotic configuration. No focal lesion. No intrahepatic or extrahepatic bile duct dilatation. No choledocholithiasis. Unremarkable gallbladder. Pancreas: No mass, inflammatory changes or other  parenchymal abnormality identified. No main pancreatic duct dilation. Spleen:  Within normal limits in size and appearance. No focal mass. Adrenals/Urinary Tract: Unremarkable adrenal glands. There is a subcentimeter simple cortical cyst in the right kidney upper pole, anteriorly (series 9, image 34). There also several scattered up to 1 cm sized sinus cysts throughout bilateral kidneys, mainly on the right. Bilateral extrarenal pelvis noted. There is mild hydronephrosis on the right. There is an approximately 7 x 10 mm filling defect in the right renal pelvis which may represent artifact versus nonobstructing renal pelvic calculus. There is also mild dilation of the right upper ureter measuring up to 7-8 mm in diameter. However, there is moderate-to-severe compression of the right mid ureter between the gravid uterus and right psoas muscle, which is likely the cause of hydronephrosis. Right ureter distal to this is nondilated. Stomach/Bowel: Visualized portions within the abdomen are unremarkable. No disproportionate dilation of bowel loops. Vascular/Lymphatic: No pathologically enlarged lymph nodes identified. No abdominal aortic aneurysm demonstrated. No ascites. Reproductive organs: Note is made of gravid uterus containing a single fetus. Please note this examination was not tailored for evaluation of the fetus. However, having said that, note is made of focal infiltrative appearing T2 mildly hypointense area in the retroplacental region along the left  lower side of the uterus. At that level, the placenta measures up to 1.7 cm in thickness and the retroplacental T2 hypointense infiltrative area measures up 4.9 cm in thickness. There is also apparent bulging of the uterine myometrium at this level, outside the normal external contour. This is of unknown etiology. Differential diagnosis favors focal myometrial contraction at this level. However, underlying infiltrative uterine lesion such as leiomyoma can not be completely excluded. Further evaluation with limited ultrasound with attention to the retroplacental area is recommended. In case of similar appearing focal bulge on ultrasound repeat examination can be done in 30 minutes or so to try to demonstrate, if this focal contraction disappears. If not, other etiologies should be considered. Bilateral ovaries are visualized and appears within normal limits. Other:  None. Musculoskeletal: No suspicious bone lesions identified. IMPRESSION: 1. Mild hydronephrosis and upper hydroureter on the right side, which is most likely secondary to compression of the mid ureter between the gravid uterus and right psoas muscle. There is an approximately 7 x 10 mm filling defect in the right renal pelvis, which may represent artifact versus nonobstructing renal pelvic calculus. 2. There is a focal infiltrative appearing T2 mildly hypointense area in the retroplacental region along the left lower side of the uterus. There is also apparent bulging of the uterine myometrium at this level, outside the normal external contour. This is of unknown etiology. Differential diagnosis favors focal myometrial contraction at this level. However, underlying infiltrative uterine lesion such as leiomyoma can not be completely excluded. Further evaluation with limited ultrasound with attention to the retroplacental area is recommended. In case of similar appearing focal bulge on ultrasound repeat examination can be done in 30 minutes or so to try to  demonstrate, if this focal contraction disappears. If not, other etiologies should be considered. 3. Otherwise unremarkable exam. Electronically Signed: By: Ree Molt M.D. On: 11/15/2023 10:18   MR PELVIS WO CONTRAST Addendum Date: 11/15/2023 ADDENDUM REPORT: 11/15/2023 10:43 ADDENDUM: Critical Value/emergent results were called by telephone at the time of interpretation on 11/15/2023 at 10:42 am to Dr. Debby Dinsmore and Dr. Roann, Who verbally acknowledged these results. Electronically Signed   By: Ree Molt M.D.   On: 11/15/2023  10:43   Result Date: 11/15/2023 CLINICAL DATA:  Pregnant, ultrasound showed mild hydronephrosis, history of nephrolithiasis with flank pain, right back pain and abdominal pain. EXAM: MRI ABDOMEN AND PELVIS WITHOUT CONTRAST TECHNIQUE: Multiplanar multisequence MR imaging of the abdomen and pelvis was performed. No intravenous contrast was administered. COMPARISON:  Renal ultrasound from earlier the same day and CT scan renal stone protocol from 08/31/2019. FINDINGS: COMBINED FINDINGS FOR BOTH MR ABDOMEN AND PELVIS Lower chest: Unremarkable MR appearance to the lung bases. No pleural effusion. No pericardial effusion. Normal heart size. Hepatobiliary: Liver is normal in size. Noncirrhotic configuration. No focal lesion. No intrahepatic or extrahepatic bile duct dilatation. No choledocholithiasis. Unremarkable gallbladder. Pancreas: No mass, inflammatory changes or other parenchymal abnormality identified. No main pancreatic duct dilation. Spleen:  Within normal limits in size and appearance. No focal mass. Adrenals/Urinary Tract: Unremarkable adrenal glands. There is a subcentimeter simple cortical cyst in the right kidney upper pole, anteriorly (series 9, image 34). There also several scattered up to 1 cm sized sinus cysts throughout bilateral kidneys, mainly on the right. Bilateral extrarenal pelvis noted. There is mild hydronephrosis on the right. There is an  approximately 7 x 10 mm filling defect in the right renal pelvis which may represent artifact versus nonobstructing renal pelvic calculus. There is also mild dilation of the right upper ureter measuring up to 7-8 mm in diameter. However, there is moderate-to-severe compression of the right mid ureter between the gravid uterus and right psoas muscle, which is likely the cause of hydronephrosis. Right ureter distal to this is nondilated. Stomach/Bowel: Visualized portions within the abdomen are unremarkable. No disproportionate dilation of bowel loops. Vascular/Lymphatic: No pathologically enlarged lymph nodes identified. No abdominal aortic aneurysm demonstrated. No ascites. Reproductive organs: Note is made of gravid uterus containing a single fetus. Please note this examination was not tailored for evaluation of the fetus. However, having said that, note is made of focal infiltrative appearing T2 mildly hypointense area in the retroplacental region along the left lower side of the uterus. At that level, the placenta measures up to 1.7 cm in thickness and the retroplacental T2 hypointense infiltrative area measures up 4.9 cm in thickness. There is also apparent bulging of the uterine myometrium at this level, outside the normal external contour. This is of unknown etiology. Differential diagnosis favors focal myometrial contraction at this level. However, underlying infiltrative uterine lesion such as leiomyoma can not be completely excluded. Further evaluation with limited ultrasound with attention to the retroplacental area is recommended. In case of similar appearing focal bulge on ultrasound repeat examination can be done in 30 minutes or so to try to demonstrate, if this focal contraction disappears. If not, other etiologies should be considered. Bilateral ovaries are visualized and appears within normal limits. Other:  None. Musculoskeletal: No suspicious bone lesions identified. IMPRESSION: 1. Mild  hydronephrosis and upper hydroureter on the right side, which is most likely secondary to compression of the mid ureter between the gravid uterus and right psoas muscle. There is an approximately 7 x 10 mm filling defect in the right renal pelvis, which may represent artifact versus nonobstructing renal pelvic calculus. 2. There is a focal infiltrative appearing T2 mildly hypointense area in the retroplacental region along the left lower side of the uterus. There is also apparent bulging of the uterine myometrium at this level, outside the normal external contour. This is of unknown etiology. Differential diagnosis favors focal myometrial contraction at this level. However, underlying infiltrative uterine lesion such as leiomyoma  can not be completely excluded. Further evaluation with limited ultrasound with attention to the retroplacental area is recommended. In case of similar appearing focal bulge on ultrasound repeat examination can be done in 30 minutes or so to try to demonstrate, if this focal contraction disappears. If not, other etiologies should be considered. 3. Otherwise unremarkable exam. Electronically Signed: By: Ree Molt M.D. On: 11/15/2023 10:18   US  Renal Result Date: 11/15/2023 EXAM: US  Retroperitoneum Complete, Renal. CLINICAL HISTORY: R flank and back pain, history of kidney stones. TECHNIQUE: Real-time ultrasound of the retroperitoneum (complete) with image documentation. COMPARISON: None provided. FINDINGS: RIGHT KIDNEY: The right kidney measures 10.1 x 4.5 x 6.0 cm with an estimated volume of 144 cc. Renal cortical thickness and echogenicity is within normal limits. Mild hydronephrosis. No intrarenal mass or calcification. LEFT KIDNEY: The left kidney measures 11.1 x 5.5 x 5.5 cm with an estimated volume of 176 cc. Renal cortical thickness and echogenicity is within normal limits. No hydronephrosis. No intrarenal mass or calcification. BLADDER: Unremarkable as visualized. IMPRESSION:  1. Mild right hydronephrosis. No intrarenal mass or calcification. Electronically signed by: Dorethia Molt MD 11/15/2023 02:37 AM EDT RP Workstation: [id number]   US  OB Limited Result Date: 11/15/2023 CLINICAL DATA:  Lower abdominal pain EXAM: LIMITED OBSTETRIC ULTRASOUND COMPARISON:  None Available. FINDINGS: Number of Fetuses: 1 Heart Rate:  155 bpm Movement: Yes Presentation: Variable Placental Location: Fundal, posterior Previa: No Amniotic Fluid (Subjective):  Within normal limits. AFI:  cm BPD: 3.1 cm 15 w  5 d MATERNAL FINDINGS: Cervix:  Appears closed. Uterus/Adnexae: No abnormality visualized. IMPRESSION: Approximately 15 week 5 day intrauterine pregnancy. Fetal heart rate 155 beats per minute. No acute maternal findings. This exam is performed on an emergent basis and does not comprehensively evaluate fetal size, dating, or anatomy; follow-up complete OB US  should be considered if further fetal assessment is warranted. Electronically Signed   By: Franky Crease M.D.   On: 11/15/2023 01:08     Assessment / Plan:   GOLDEN Watford is a 28 y.o. [id number] who presents with abdominal pain, right flank pain, right back pain, dysuria and some N/V  1. Acute Pyelo - managed by hospitalist  2. 16 week pregnancy - Collected prenatal labs - orders placed on 9/26 - Establish prenatal care as soon as she is discharged - information given in discharge AVS - Prenatal vitamin added to orders   Thank you for the opportunity to be involved with this pt's care.

## 2023-11-15 NOTE — Progress Notes (Signed)
 PHARMACY CONSULT NOTE - FOLLOW UP  Pharmacy Consult for Electrolyte Monitoring and Replacement   Recent Labs: Potassium (mmol/L)  Date Value  11/14/2023 3.1 (L)   Calcium (mg/dL)  Date Value  90/74/7974 9.0   Albumin (g/dL)  Date Value  90/74/7974 3.5   Sodium (mmol/L)  Date Value  11/14/2023 135     Assessment: 9/25 @ 2242 = 3.1  Goal of Therapy:  Electrolytes WNL   Plan:  KCl 20 mEq PO Q4H X 2 doses.  - recheck electrolytes on 9/27 with AM labs   Ashantia Amaral D ,PharmD Clinical Pharmacist 11/15/2023 6:02 AM

## 2023-11-15 NOTE — ED Provider Notes (Signed)
 [facility] Provider Note    Event Date/Time   First MD Initiated Contact with Patient 11/15/23 [phone]     (approximate)   History   Abdominal Pain   HPI  Kathryn Johnston is a 28 y.o. female with history of nephrolithiasis, asthma, frequent UTIs, presenting with abdominal cramping, right flank and right back pain.  She notes dysuria.  No fever.  She did have some nausea vomiting.  No vaginal bleeding or discharge, no hematuria.  Patient is pregnant but has not had any prenatal care.  States that she is going to follow-up with health department soon.  On independent review, she had a CT renal stone study that was done in 2021 that did not show any hydronephrosis or urolithiasis.  Has had prior positive urine cultures that are susceptible to ceftriaxone .     Physical Exam   Triage Vital Signs: ED Triage Vitals  Encounter Vitals Group     BP 11/14/23 2239 112/70     Girls Systolic BP Percentile --      Girls Diastolic BP Percentile --      Boys Systolic BP Percentile --      Boys Diastolic BP Percentile --      Pulse Rate 11/14/23 2239 (!) 102     Resp 11/14/23 2239 20     Temp 11/14/23 2239 98 F (36.7 C)     Temp src --      SpO2 11/14/23 2239 100 %     Weight 11/14/23 2241 150 lb (68 kg)     Height 11/14/23 2241 5' 2 (1.575 m)     Head Circumference --      Peak Flow --      Pain Score 11/14/23 2238 10     Pain Loc --      Pain Education --      Exclude from Growth Chart --     Most recent vital signs: Vitals:   11/15/23 0330 11/15/23 0553  BP: 93/61   Pulse: 73   Resp:    Temp:  98.2 F (36.8 C)  SpO2: 97%      General: Awake, no distress.  CV:  Good peripheral perfusion.  Resp:  Normal effort.  Abd:  Gravid, soft nontender Other:  No flank or CVA tenderness on palpation, she does have mild lower right paralumbar tenderness.  No overlying rash, ecchymosis, swelling, erythema.   ED Results / Procedures / Treatments    Labs (all labs ordered are listed, but only abnormal results are displayed) Labs Reviewed  CBC - Abnormal; Notable for the following components:      Result Value   WBC 10.7 (*)    Hemoglobin 10.1 (*)    HCT 32.1 (*)    MCV 77.2 (*)    MCH 24.3 (*)    All other components within normal limits  URINALYSIS, ROUTINE W REFLEX MICROSCOPIC - Abnormal; Notable for the following components:   Color, Urine YELLOW (*)    APPearance CLOUDY (*)    Hgb urine dipstick SMALL (*)    Ketones, ur 5 (*)    Nitrite POSITIVE (*)    Leukocytes,Ua LARGE (*)    Bacteria, UA RARE (*)    All other components within normal limits  HCG, QUANTITATIVE, PREGNANCY - Abnormal; Notable for the following components:   hCG, Beta Chain, Quant, S 23,481 (*)    All other components within normal limits  COMPREHENSIVE METABOLIC PANEL WITH GFR - Abnormal; Notable  for the following components:   Potassium 3.1 (*)    CO2 21 (*)    Glucose, Bld 110 (*)    BUN 5 (*)    Alkaline Phosphatase 35 (*)    All other components within normal limits  POC URINE PREG, ED - Abnormal; Notable for the following components:   Preg Test, Ur POSITIVE (*)    All other components within normal limits  CULTURE, BLOOD (ROUTINE X 2)  CULTURE, BLOOD (ROUTINE X 2)  LIPASE, BLOOD  LACTIC ACID, PLASMA  LACTIC ACID, PLASMA  ABO/RH      RADIOLOGY On my independent interpretation, ultrasound shows positive IUP   PROCEDURES:  Critical Care performed: No  Procedures   MEDICATIONS ORDERED IN ED: Medications  HYDROmorphone  (DILAUDID ) injection 1 mg (has no administration in time range)  ondansetron  (ZOFRAN ) injection 4 mg (has no administration in time range)  promethazine (PHENERGAN) 12.5 mg in sodium chloride  0.9 % 50 mL IVPB (has no administration in time range)  ondansetron  (ZOFRAN ) injection 4 mg (4 mg Intravenous Given 11/15/23 0147)  sodium chloride  0.9 % bolus 1,000 mL (1,000 mLs Intravenous New Bag/Given 11/15/23 0147)   acetaminophen  (TYLENOL ) tablet 1,000 mg (1,000 mg Oral Given 11/15/23 0147)  cefTRIAXone  (ROCEPHIN ) 1 g in sodium chloride  0.9 % 100 mL IVPB (0 g Intravenous Stopped 11/15/23 0330)  sodium chloride  0.9 % bolus 1,000 mL (1,000 mLs Intravenous New Bag/Given 11/15/23 0419)  fentaNYL  (SUBLIMAZE ) injection 25 mcg (25 mcg Intravenous Given 11/15/23 0419)     IMPRESSION / MDM / ASSESSMENT AND PLAN / ED COURSE  I reviewed the triage vital signs and the nursing notes.                              Differential diagnosis includes, but is not limited to, UTI, nephrolithiasis, viral illness, did also consider pyelonephritis given her back pain and dysuria, no vaginal discharge, gush of fluid to suggest active miscarriage at this time.  Given that she has not had any prenatal care, will get labs, UA, pregnancy test, ultrasound of her kidneys as well as ultrasound of her pelvis.  Give her some fluids, Zofran , Tylenol  here.  Patient's presentation is most consistent with acute presentation with potential threat to life or bodily function.  Independent interpretation of labs and imaging below.  UA is consistent with UTI, prior urine culture susceptible to ceftriaxone , will give her dose here.  Patient requesting additional pain medications, will give her some IM fentanyl , blood pressures initially 100s to 110s, did dip a little to the 90s, she is getting some IV fluids.  Will add on lactate as well as blood cultures.  Her ultrasound renal study was positive for mild right hydro, no nephrolithiasis or ureterolithiasis was noted, will add on an MRI abdomen pelvis.  She will need to be admitted for further management.  Consult to hospitalist who is agreeable with the plan for admission and will evaluate the patient.  She is admitted.    Clinical Course as of 11/15/23 0554  Fri Nov 15, 2023  0135 US  OB Limited IMPRESSION: Approximately 15 week 5 day intrauterine pregnancy. Fetal heart rate 155 beats per minute. No  acute maternal findings.  This exam is performed on an emergent basis and does not comprehensively evaluate fetal size, dating, or anatomy; follow-up complete OB US  should be considered if further fetal assessment is warranted.   [TT]  0240 US  Renal 1. Mild right  hydronephrosis. No intrarenal mass or calcification.  [TT]    Clinical Course User Index [TT] Waymond Lorelle Cummins, MD     FINAL CLINICAL IMPRESSION(S) / ED DIAGNOSES   Final diagnoses:  Pregnancy, unspecified gestational age  Hydronephrosis, unspecified hydronephrosis type  Pyelonephritis  Dysuria  Abdominal pain, unspecified abdominal location  Flank pain  Acute right-sided low back pain without sciatica     Rx / DC Orders   ED Discharge Orders     None        Note:  This document was prepared using Dragon voice recognition software and may include unintentional dictation errors.    Waymond Lorelle Cummins, MD 11/15/23 [phone]

## 2023-11-16 DIAGNOSIS — D509 Iron deficiency anemia, unspecified: Secondary | ICD-10-CM | POA: Diagnosis present

## 2023-11-16 DIAGNOSIS — Z888 Allergy status to other drugs, medicaments and biological substances status: Secondary | ICD-10-CM | POA: Diagnosis not present

## 2023-11-16 DIAGNOSIS — Z87440 Personal history of urinary (tract) infections: Secondary | ICD-10-CM | POA: Diagnosis not present

## 2023-11-16 DIAGNOSIS — J4520 Mild intermittent asthma, uncomplicated: Secondary | ICD-10-CM | POA: Diagnosis present

## 2023-11-16 DIAGNOSIS — O99282 Endocrine, nutritional and metabolic diseases complicating pregnancy, second trimester: Secondary | ICD-10-CM | POA: Diagnosis present

## 2023-11-16 DIAGNOSIS — Z86718 Personal history of other venous thrombosis and embolism: Secondary | ICD-10-CM | POA: Diagnosis not present

## 2023-11-16 DIAGNOSIS — E876 Hypokalemia: Secondary | ICD-10-CM | POA: Diagnosis present

## 2023-11-16 DIAGNOSIS — N136 Pyonephrosis: Secondary | ICD-10-CM | POA: Diagnosis present

## 2023-11-16 DIAGNOSIS — Z3A15 15 weeks gestation of pregnancy: Secondary | ICD-10-CM | POA: Diagnosis not present

## 2023-11-16 DIAGNOSIS — Z87442 Personal history of urinary calculi: Secondary | ICD-10-CM | POA: Diagnosis not present

## 2023-11-16 DIAGNOSIS — O219 Vomiting of pregnancy, unspecified: Secondary | ICD-10-CM | POA: Diagnosis present

## 2023-11-16 DIAGNOSIS — O2302 Infections of kidney in pregnancy, second trimester: Secondary | ICD-10-CM | POA: Diagnosis present

## 2023-11-16 DIAGNOSIS — N10 Acute tubulo-interstitial nephritis: Secondary | ICD-10-CM | POA: Diagnosis not present

## 2023-11-16 DIAGNOSIS — Z87891 Personal history of nicotine dependence: Secondary | ICD-10-CM | POA: Diagnosis not present

## 2023-11-16 DIAGNOSIS — Z833 Family history of diabetes mellitus: Secondary | ICD-10-CM | POA: Diagnosis not present

## 2023-11-16 DIAGNOSIS — R300 Dysuria: Secondary | ICD-10-CM | POA: Diagnosis present

## 2023-11-16 DIAGNOSIS — Z79899 Other long term (current) drug therapy: Secondary | ICD-10-CM | POA: Diagnosis not present

## 2023-11-16 DIAGNOSIS — O99512 Diseases of the respiratory system complicating pregnancy, second trimester: Secondary | ICD-10-CM | POA: Diagnosis present

## 2023-11-16 LAB — BASIC METABOLIC PANEL WITH GFR
Anion gap: 5 (ref 5–15)
BUN: <5 mg/dL — ABNORMAL LOW (ref 6–20)
CO2: 23 mmol/L (ref 22–32)
Calcium: 8 mg/dL — ABNORMAL LOW (ref 8.9–10.3)
Chloride: 107 mmol/L (ref 98–111)
Creatinine, Ser: 0.5 mg/dL (ref 0.44–1.00)
GFR, Estimated: >60 mL/min (ref >60)
Glucose, Bld: 84 mg/dL (ref 70–99)
Potassium: 3.5 mmol/L (ref 3.5–5.1)
Sodium: 135 mmol/L (ref 135–145)

## 2023-11-16 LAB — FERRITIN: Ferritin: 182 ng/mL (ref 11–307)

## 2023-11-16 LAB — RPR: RPR Ser Ql: NONREACTIVE

## 2023-11-16 LAB — HIV ANTIBODY (ROUTINE TESTING W REFLEX): HIV Screen 4th Generation wRfx: NONREACTIVE

## 2023-11-16 LAB — CHLAMYDIA/NGC RT PCR (ARMC ONLY)
Chlamydia Tr: NOT DETECTED
N gonorrhoeae: NOT DETECTED

## 2023-11-16 LAB — PHOSPHORUS: Phosphorus: 3 mg/dL (ref 2.5–4.6)

## 2023-11-16 LAB — MAGNESIUM: Magnesium: 2 mg/dL (ref 1.7–2.4)

## 2023-11-16 MED ORDER — ACETAMINOPHEN 325 MG PO TABS
650.0000 mg | ORAL_TABLET | Freq: Four times a day (QID) | ORAL | Status: DC | PRN
  Administered 2023-11-16 – 2023-11-18 (×4): 650 mg via ORAL
  Filled 2023-11-16 (×4): qty 2

## 2023-11-16 MED ORDER — DOCUSATE SODIUM 100 MG PO CAPS
100.0000 mg | ORAL_CAPSULE | Freq: Every day | ORAL | Status: DC | PRN
  Administered 2023-11-16: 100 mg via ORAL
  Filled 2023-11-16: qty 1

## 2023-11-16 MED ORDER — HYDROMORPHONE HCL 1 MG/ML IJ SOLN
0.5000 mg | INTRAMUSCULAR | Status: AC | PRN
  Administered 2023-11-16 – 2023-11-17 (×2): 0.5 mg via INTRAVENOUS
  Filled 2023-11-16 (×2): qty 0.5

## 2023-11-16 NOTE — Progress Notes (Signed)
 Progress Note    Kathryn Johnston  FMW:[id number] DOB: 1995-08-04  DOA: 11/15/2023 PCP: Patient, No Pcp Per      Brief Narrative:    Medical records reviewed and are as summarized below:  Kathryn Johnston is a 28 y.o. female, [redacted] weeks pregnant, with medical history significant for, asthma, history of lower extremity DVT 10 years ago, history of nephrolithiasis, frequent UTIs, previous urine culture positive for Enterobacter and E. coli, sensitive to ceftriaxone .  She presented to the hospital with abdominal pain mainly around the right flank and right mid back area, dysuria and nausea.  She was admitted to the hospital for acute pyelonephritis.       Assessment/Plan:   Principal Problem:   Acute pyelonephritis Active Problems:   Personal history of urinary infection   H/O deep venous thrombosis   Asthma    Body mass index is 23.83 kg/m.   Acute right-sided pyelonephritis: Continue IV ceftriaxone .  Analgesics as needed for pain.  Follow-up blood cultures. Unfortunately, urine culture was not ordered on admission.  Underlying urine culture was ordered but was informed by Larraine, from the lab, that  previous urine specimen is more than 24 hours old and add-on urine culture cannot be performed on that specimen. Mild right hydronephrosis and mild hydroureter, questionable nonobstructing right renal pelvic calculus.  Outpatient follow-up with urologist.   15 weeks pregnancy: Follow-up with obstetrician.   Mild intermittent asthma: no acute issues.  Bronchodilators as needed.   Hypokalemia: Improved  Diet Order             Diet regular Room service appropriate? Yes; Fluid consistency: Thin  Diet effective now                                  Consultants: Obstetrician  Procedures: None    Medications:    heparin  injection (subcutaneous)  5,000 Units Subcutaneous Q8H   pantoprazole  (PROTONIX ) IV  40 mg Intravenous Q12H   prenatal  multivitamin  1 tablet Oral Q1200   Continuous Infusions:  cefTRIAXone  (ROCEPHIN )  IV 1 g (11/16/23 0520)   promethazine (PHENERGAN) injection (IM or IVPB)       Anti-infectives (From admission, onward)    Start     Dose/Rate Route Frequency Ordered Stop   11/16/23 0500  cefTRIAXone  (ROCEPHIN ) 1 g in sodium chloride  0.9 % 100 mL IVPB        1 g 200 mL/hr over 30 Minutes Intravenous Every 24 hours 11/15/23 1126     11/15/23 0300  cefTRIAXone  (ROCEPHIN ) 1 g in sodium chloride  0.9 % 100 mL IVPB        1 g 200 mL/hr over 30 Minutes Intravenous  Once 11/15/23 0245 11/15/23 0330              Family Communication/Anticipated D/C date and plan/Code Status   DVT prophylaxis: heparin  injection 5,000 Units Start: 11/15/23 1400     Code Status: Not on file  Family Communication: None Disposition Plan: Plan to discharge home   Status is: Observation The patient will require care spanning > 2 midnights and should be moved to inpatient because: Pyelonephritis       Subjective:   Interval events noted.  She complains of right flank pain.  Objective:    Vitals:   11/15/23 1509 11/15/23 2024 11/16/23 0412 11/16/23 0841  BP: 102/64 116/69 110/72 117/78  Pulse: 81 75 80 83  Resp: 16 20 20 16   Temp: 98 F (36.7 C) 97.7 F (36.5 C) (!) 97.5 F (36.4 C) 98 F (36.7 C)  TempSrc: Oral Oral Oral Oral  SpO2: 100% 100% 100% 100%  Weight:      Height:       No data found.   Intake/Output Summary (Last 24 hours) at 11/16/2023 1211 Last data filed at 11/16/2023 9366 Gross per 24 hour  Intake 1871.13 ml  Output --  Net 1871.13 ml   Filed Weights   11/14/23 2241 11/15/23 0634  Weight: 68 kg 59.1 kg    Exam:  GEN: NAD SKIN: Warm and dry EYES: No pallor or icterus ENT: MMM CV: RRR PULM: CTA B ABD: soft, distended from gravid uterus, NT, +BS CNS: AAO x 3, non focal EXT: No edema or tenderness GU: Right CVA tenderness        Data Reviewed:   I have  personally reviewed following labs and imaging studies:  Labs: Labs show the following:   Basic Metabolic Panel: Recent Labs  Lab 11/14/23 2242 11/16/23 0509  NA 135 135  K 3.1* 3.5  CL 102 107  CO2 21* 23  GLUCOSE 110* 84  BUN 5* <5*  CREATININE 0.73 0.50  CALCIUM 9.0 8.0*  MG  --  2.0  PHOS  --  3.0   GFR Estimated Creatinine Clearance: 82.1 mL/min (by C-G formula based on SCr of 0.5 mg/dL). Liver Function Tests: Recent Labs  Lab 11/14/23 2242  AST 15  ALT 9  ALKPHOS 35*  BILITOT 0.5  PROT 7.3  ALBUMIN 3.5   Recent Labs  Lab 11/14/23 12/07/40  LIPASE 16   No results for input(s): AMMONIA in the last 168 hours. Coagulation profile No results for input(s): INR, PROTIME in the last 168 hours.  CBC: Recent Labs  Lab 11/14/23 2242  WBC 10.7*  HGB 10.1*  HCT 32.1*  MCV 77.2*  PLT 297   Cardiac Enzymes: No results for input(s): CKTOTAL, CKMB, CKMBINDEX, TROPONINI in the last 168 hours. BNP (last 3 results) No results for input(s): PROBNP in the last 8760 hours. CBG: No results for input(s): GLUCAP in the last 168 hours. D-Dimer: No results for input(s): DDIMER in the last 72 hours. Hgb A1c: Recent Labs    11/15/23 12-08-2022  HGBA1C 4.6*   Lipid Profile: No results for input(s): CHOL, HDL, LDLCALC, TRIG, CHOLHDL, LDLDIRECT in the last 72 hours. Thyroid  function studies: Recent Labs    11/15/23 2022/12/08  TSH 1.219   Anemia work up: No results for input(s): VITAMINB12, FOLATE, FERRITIN, TIBC, IRON, RETICCTPCT in the last 72 hours. Sepsis Labs: Recent Labs  Lab 11/14/23 12-07-40 11/15/23 0434  WBC 10.7*  --   LATICACIDVEN  --  1.0  0.5    Microbiology Recent Results (from the past 240 hours)  Culture, blood (routine x 2)     Status: None (Preliminary result)   Collection Time: 11/15/23  4:33 AM   Specimen: BLOOD  Result Value Ref Range Status   Specimen Description BLOOD RIGHT ANTECUBITAL  Final   Special  Requests   Final    BOTTLES DRAWN AEROBIC AND ANAEROBIC Blood Culture adequate volume   Culture   Final    NO GROWTH 1 DAY Performed at [facility], [address] [place], [place] [address]    Report Status PENDING  Incomplete  Culture, blood (routine x 2)     Status: None (Preliminary result)   Collection Time: 11/15/23  4:34  AM   Specimen: BLOOD  Result Value Ref Range Status   Specimen Description BLOOD LEFT ANTECUBITAL  Final   Special Requests   Final    BOTTLES DRAWN AEROBIC AND ANAEROBIC Blood Culture adequate volume   Culture   Final    NO GROWTH 1 DAY Performed at [facility], [address], [place], [place] [address]    Report Status PENDING  Incomplete    Procedures and diagnostic studies:  MR ABDOMEN WO CONTRAST Addendum Date: 11/15/2023 ADDENDUM REPORT: 11/15/2023 10:43 ADDENDUM: Critical Value/emergent results were called by telephone at the time of interpretation on 11/15/2023 at 10:42 am to Dr. Debby Dinsmore and Dr. Roann, Who verbally acknowledged these results. Electronically Signed   By: Ree Molt M.D.   On: 11/15/2023 10:43   Result Date: 11/15/2023 CLINICAL DATA:  Pregnant, ultrasound showed mild hydronephrosis, history of nephrolithiasis with flank pain, right back pain and abdominal pain. EXAM: MRI ABDOMEN AND PELVIS WITHOUT CONTRAST TECHNIQUE: Multiplanar multisequence MR imaging of the abdomen and pelvis was performed. No intravenous contrast was administered. COMPARISON:  Renal ultrasound from earlier the same day and CT scan renal stone protocol from 08/31/2019. FINDINGS: COMBINED FINDINGS FOR BOTH MR ABDOMEN AND PELVIS Lower chest: Unremarkable MR appearance to the lung bases. No pleural effusion. No pericardial effusion. Normal heart size. Hepatobiliary: Liver is normal in size. Noncirrhotic configuration. No focal lesion. No intrahepatic or extrahepatic bile duct dilatation. No choledocholithiasis. Unremarkable gallbladder.  Pancreas: No mass, inflammatory changes or other parenchymal abnormality identified. No main pancreatic duct dilation. Spleen:  Within normal limits in size and appearance. No focal mass. Adrenals/Urinary Tract: Unremarkable adrenal glands. There is a subcentimeter simple cortical cyst in the right kidney upper pole, anteriorly (series 9, image 34). There also several scattered up to 1 cm sized sinus cysts throughout bilateral kidneys, mainly on the right. Bilateral extrarenal pelvis noted. There is mild hydronephrosis on the right. There is an approximately 7 x 10 mm filling defect in the right renal pelvis which may represent artifact versus nonobstructing renal pelvic calculus. There is also mild dilation of the right upper ureter measuring up to 7-8 mm in diameter. However, there is moderate-to-severe compression of the right mid ureter between the gravid uterus and right psoas muscle, which is likely the cause of hydronephrosis. Right ureter distal to this is nondilated. Stomach/Bowel: Visualized portions within the abdomen are unremarkable. No disproportionate dilation of bowel loops. Vascular/Lymphatic: No pathologically enlarged lymph nodes identified. No abdominal aortic aneurysm demonstrated. No ascites. Reproductive organs: Note is made of gravid uterus containing a single fetus. Please note this examination was not tailored for evaluation of the fetus. However, having said that, note is made of focal infiltrative appearing T2 mildly hypointense area in the retroplacental region along the left lower side of the uterus. At that level, the placenta measures up to 1.7 cm in thickness and the retroplacental T2 hypointense infiltrative area measures up 4.9 cm in thickness. There is also apparent bulging of the uterine myometrium at this level, outside the normal external contour. This is of unknown etiology. Differential diagnosis favors focal myometrial contraction at this level. However, underlying  infiltrative uterine lesion such as leiomyoma can not be completely excluded. Further evaluation with limited ultrasound with attention to the retroplacental area is recommended. In case of similar appearing focal bulge on ultrasound repeat examination can be done in 30 minutes or so to try to demonstrate, if this focal contraction disappears. If not, other etiologies should be considered.  Bilateral ovaries are visualized and appears within normal limits. Other:  None. Musculoskeletal: No suspicious bone lesions identified. IMPRESSION: 1. Mild hydronephrosis and upper hydroureter on the right side, which is most likely secondary to compression of the mid ureter between the gravid uterus and right psoas muscle. There is an approximately 7 x 10 mm filling defect in the right renal pelvis, which may represent artifact versus nonobstructing renal pelvic calculus. 2. There is a focal infiltrative appearing T2 mildly hypointense area in the retroplacental region along the left lower side of the uterus. There is also apparent bulging of the uterine myometrium at this level, outside the normal external contour. This is of unknown etiology. Differential diagnosis favors focal myometrial contraction at this level. However, underlying infiltrative uterine lesion such as leiomyoma can not be completely excluded. Further evaluation with limited ultrasound with attention to the retroplacental area is recommended. In case of similar appearing focal bulge on ultrasound repeat examination can be done in 30 minutes or so to try to demonstrate, if this focal contraction disappears. If not, other etiologies should be considered. 3. Otherwise unremarkable exam. Electronically Signed: By: Ree Molt M.D. On: 11/15/2023 10:18   MR PELVIS WO CONTRAST Addendum Date: 11/15/2023 ADDENDUM REPORT: 11/15/2023 10:43 ADDENDUM: Critical Value/emergent results were called by telephone at the time of interpretation on 11/15/2023 at 10:42 am to  Dr. Debby Dinsmore and Dr. Roann, Who verbally acknowledged these results. Electronically Signed   By: Ree Molt M.D.   On: 11/15/2023 10:43   Result Date: 11/15/2023 CLINICAL DATA:  Pregnant, ultrasound showed mild hydronephrosis, history of nephrolithiasis with flank pain, right back pain and abdominal pain. EXAM: MRI ABDOMEN AND PELVIS WITHOUT CONTRAST TECHNIQUE: Multiplanar multisequence MR imaging of the abdomen and pelvis was performed. No intravenous contrast was administered. COMPARISON:  Renal ultrasound from earlier the same day and CT scan renal stone protocol from 08/31/2019. FINDINGS: COMBINED FINDINGS FOR BOTH MR ABDOMEN AND PELVIS Lower chest: Unremarkable MR appearance to the lung bases. No pleural effusion. No pericardial effusion. Normal heart size. Hepatobiliary: Liver is normal in size. Noncirrhotic configuration. No focal lesion. No intrahepatic or extrahepatic bile duct dilatation. No choledocholithiasis. Unremarkable gallbladder. Pancreas: No mass, inflammatory changes or other parenchymal abnormality identified. No main pancreatic duct dilation. Spleen:  Within normal limits in size and appearance. No focal mass. Adrenals/Urinary Tract: Unremarkable adrenal glands. There is a subcentimeter simple cortical cyst in the right kidney upper pole, anteriorly (series 9, image 34). There also several scattered up to 1 cm sized sinus cysts throughout bilateral kidneys, mainly on the right. Bilateral extrarenal pelvis noted. There is mild hydronephrosis on the right. There is an approximately 7 x 10 mm filling defect in the right renal pelvis which may represent artifact versus nonobstructing renal pelvic calculus. There is also mild dilation of the right upper ureter measuring up to 7-8 mm in diameter. However, there is moderate-to-severe compression of the right mid ureter between the gravid uterus and right psoas muscle, which is likely the cause of hydronephrosis. Right ureter distal  to this is nondilated. Stomach/Bowel: Visualized portions within the abdomen are unremarkable. No disproportionate dilation of bowel loops. Vascular/Lymphatic: No pathologically enlarged lymph nodes identified. No abdominal aortic aneurysm demonstrated. No ascites. Reproductive organs: Note is made of gravid uterus containing a single fetus. Please note this examination was not tailored for evaluation of the fetus. However, having said that, note is made of focal infiltrative appearing T2 mildly hypointense area in the retroplacental region along the  left lower side of the uterus. At that level, the placenta measures up to 1.7 cm in thickness and the retroplacental T2 hypointense infiltrative area measures up 4.9 cm in thickness. There is also apparent bulging of the uterine myometrium at this level, outside the normal external contour. This is of unknown etiology. Differential diagnosis favors focal myometrial contraction at this level. However, underlying infiltrative uterine lesion such as leiomyoma can not be completely excluded. Further evaluation with limited ultrasound with attention to the retroplacental area is recommended. In case of similar appearing focal bulge on ultrasound repeat examination can be done in 30 minutes or so to try to demonstrate, if this focal contraction disappears. If not, other etiologies should be considered. Bilateral ovaries are visualized and appears within normal limits. Other:  None. Musculoskeletal: No suspicious bone lesions identified. IMPRESSION: 1. Mild hydronephrosis and upper hydroureter on the right side, which is most likely secondary to compression of the mid ureter between the gravid uterus and right psoas muscle. There is an approximately 7 x 10 mm filling defect in the right renal pelvis, which may represent artifact versus nonobstructing renal pelvic calculus. 2. There is a focal infiltrative appearing T2 mildly hypointense area in the retroplacental region along  the left lower side of the uterus. There is also apparent bulging of the uterine myometrium at this level, outside the normal external contour. This is of unknown etiology. Differential diagnosis favors focal myometrial contraction at this level. However, underlying infiltrative uterine lesion such as leiomyoma can not be completely excluded. Further evaluation with limited ultrasound with attention to the retroplacental area is recommended. In case of similar appearing focal bulge on ultrasound repeat examination can be done in 30 minutes or so to try to demonstrate, if this focal contraction disappears. If not, other etiologies should be considered. 3. Otherwise unremarkable exam. Electronically Signed: By: Ree Molt M.D. On: 11/15/2023 10:18   US  Renal Result Date: 11/15/2023 EXAM: US  Retroperitoneum Complete, Renal. CLINICAL HISTORY: R flank and back pain, history of kidney stones. TECHNIQUE: Real-time ultrasound of the retroperitoneum (complete) with image documentation. COMPARISON: None provided. FINDINGS: RIGHT KIDNEY: The right kidney measures 10.1 x 4.5 x 6.0 cm with an estimated volume of 144 cc. Renal cortical thickness and echogenicity is within normal limits. Mild hydronephrosis. No intrarenal mass or calcification. LEFT KIDNEY: The left kidney measures 11.1 x 5.5 x 5.5 cm with an estimated volume of 176 cc. Renal cortical thickness and echogenicity is within normal limits. No hydronephrosis. No intrarenal mass or calcification. BLADDER: Unremarkable as visualized. IMPRESSION: 1. Mild right hydronephrosis. No intrarenal mass or calcification. Electronically signed by: Dorethia Molt MD 11/15/2023 02:37 AM EDT RP Workstation: [id number]   US  OB Limited Result Date: 11/15/2023 CLINICAL DATA:  Lower abdominal pain EXAM: LIMITED OBSTETRIC ULTRASOUND COMPARISON:  None Available. FINDINGS: Number of Fetuses: 1 Heart Rate:  155 bpm Movement: Yes Presentation: Variable Placental Location: Fundal,  posterior Previa: No Amniotic Fluid (Subjective):  Within normal limits. AFI:  cm BPD: 3.1 cm 15 w  5 d MATERNAL FINDINGS: Cervix:  Appears closed. Uterus/Adnexae: No abnormality visualized. IMPRESSION: Approximately 15 week 5 day intrauterine pregnancy. Fetal heart rate 155 beats per minute. No acute maternal findings. This exam is performed on an emergent basis and does not comprehensively evaluate fetal size, dating, or anatomy; follow-up complete OB US  should be considered if further fetal assessment is warranted. Electronically Signed   By: Franky Crease M.D.   On: 11/15/2023 01:08  LOS: 0 days   Muaz Shorey  [facility]   Pager on [email or web address]. If 7PM-7AM, please contact night-coverage at [email or web address]     11/16/2023, 12:11 PM

## 2023-11-16 NOTE — Plan of Care (Signed)
  Problem: Education: Goal: Knowledge of General Education information will improve Description: Including pain rating scale, medication(s)/side effects and non-pharmacologic comfort measures Outcome: Progressing   Problem: Clinical Measurements: Goal: Will remain free from infection Outcome: Progressing   Problem: Clinical Measurements: Goal: Respiratory complications will improve Outcome: Progressing   Problem: Clinical Measurements: Goal: Cardiovascular complication will be avoided Outcome: Progressing   Problem: Activity: Goal: Risk for activity intolerance will decrease Outcome: Progressing   Problem: Pain Managment: Goal: General experience of comfort will improve and/or be controlled Outcome: Progressing

## 2023-11-16 NOTE — Progress Notes (Addendum)
 Pt is requesting something to help with bowels. MD Cleatus made aware.  Update 0546: See new order,

## 2023-11-16 NOTE — Progress Notes (Signed)
 PHARMACY CONSULT NOTE - FOLLOW UP  Pharmacy Consult for Electrolyte Monitoring and Replacement   Recent Labs: Potassium (mmol/L)  Date Value  11/16/2023 3.5   Magnesium (mg/dL)  Date Value  90/72/7974 2.0   Calcium (mg/dL)  Date Value  90/72/7974 8.0 (L)   Albumin (g/dL)  Date Value  90/74/7974 3.5   Phosphorus (mg/dL)  Date Value  90/72/7974 3.0   Sodium (mmol/L)  Date Value  11/16/2023 135    Assessment: 28 y.o. female with medical history significant for asthma, history of lower extremity DVT 10 years ago completed anticoagulation, mild intermittent, history of nephrolithiasis, frequent UTIs, 15 weeks pregnancy came into ED complaining of abdominal pain. Pharmacy is asked to follow and replace electrolytes  Goal of Therapy:  Electrolytes WNL  Plan:  ---no electrolytes replacement warranted for today ---recheck electrolytes in am  Adriana JONETTA Bolster ,PharmD, BCPS Clinical Pharmacist 11/16/2023 8:57 AM

## 2023-11-17 DIAGNOSIS — N10 Acute tubulo-interstitial nephritis: Secondary | ICD-10-CM | POA: Diagnosis not present

## 2023-11-17 LAB — CBC
HCT: 24.1 % — ABNORMAL LOW (ref 36.0–46.0)
Hemoglobin: 7.5 g/dL — ABNORMAL LOW (ref 12.0–15.0)
MCH: 23.8 pg — ABNORMAL LOW (ref 26.0–34.0)
MCHC: 31.1 g/dL (ref 30.0–36.0)
MCV: 76.5 fL — ABNORMAL LOW (ref 80.0–100.0)
Platelets: 230 K/uL (ref 150–400)
RBC: 3.15 MIL/uL — ABNORMAL LOW (ref 3.87–5.11)
RDW: 12.5 % (ref 11.5–15.5)
WBC: 6.3 K/uL (ref 4.0–10.5)
nRBC: 0 % (ref 0.0–0.2)

## 2023-11-17 LAB — BASIC METABOLIC PANEL WITH GFR
Anion gap: 8 (ref 5–15)
BUN: <5 mg/dL — ABNORMAL LOW (ref 6–20)
CO2: 24 mmol/L (ref 22–32)
Calcium: 8.4 mg/dL — ABNORMAL LOW (ref 8.9–10.3)
Chloride: 105 mmol/L (ref 98–111)
Creatinine, Ser: 0.68 mg/dL (ref 0.44–1.00)
GFR, Estimated: >60 mL/min (ref >60)
Glucose, Bld: 85 mg/dL (ref 70–99)
Potassium: 3.1 mmol/L — ABNORMAL LOW (ref 3.5–5.1)
Sodium: 137 mmol/L (ref 135–145)

## 2023-11-17 LAB — URINE CULTURE: Culture: NO GROWTH

## 2023-11-17 LAB — RUBELLA SCREEN: Rubella: 4.83 {index} (ref >0.99)

## 2023-11-17 LAB — MAGNESIUM: Magnesium: 2 mg/dL (ref 1.7–2.4)

## 2023-11-17 LAB — VARICELLA ZOSTER ANTIBODY, IGG: Varicella IgG: NONREACTIVE

## 2023-11-17 MED ORDER — POLYETHYLENE GLYCOL 3350 17 G PO PACK
17.0000 g | PACK | Freq: Every day | ORAL | Status: DC | PRN
  Administered 2023-11-17: 17 g via ORAL
  Filled 2023-11-17: qty 1

## 2023-11-17 MED ORDER — HYDROMORPHONE HCL 1 MG/ML IJ SOLN
0.5000 mg | INTRAMUSCULAR | Status: AC | PRN
  Administered 2023-11-17 (×2): 0.5 mg via INTRAVENOUS
  Filled 2023-11-17 (×2): qty 0.5

## 2023-11-17 MED ORDER — THIAMINE HCL 100 MG/ML IJ SOLN
Freq: Once | INTRAVENOUS | Status: AC
  Filled 2023-11-17: qty 1000

## 2023-11-17 MED ORDER — ONDANSETRON HCL 4 MG/2ML IJ SOLN
4.0000 mg | Freq: Four times a day (QID) | INTRAMUSCULAR | Status: DC | PRN
  Administered 2023-11-17: 4 mg via INTRAVENOUS
  Filled 2023-11-17: qty 2

## 2023-11-17 MED ORDER — POTASSIUM CHLORIDE CRYS ER 20 MEQ PO TBCR
40.0000 meq | EXTENDED_RELEASE_TABLET | Freq: Once | ORAL | Status: AC
  Administered 2023-11-17: 40 meq via ORAL
  Filled 2023-11-17: qty 2

## 2023-11-17 NOTE — Progress Notes (Signed)
 ANTEPARTUM PROGRESS NOTE  Kathryn Johnston is a 28 y.o. [id number] [redacted] weeks pregnant, Estimated Date of Delivery: 04/26/24, admitted with pyelonephritis, managed by the hospitalist service. 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 The patient reports persistent nausea and has been unable to eat since admission. She denies any vaginal bleeding, contractions, or loss of fluid. Fetal heart tones via Doppler are 130 bpm,  She has a history of high-risk pregnancies and has always delivered at [facility] in [place], where she plans to establish care post-discharge.    Length of Stay:  1 Days. Admitted 11/15/2023  Subjective:   She reports:  -no leakage of fluid -no vaginal bleeding -no contractions  Vitals:  BP (!) 90/49 (BP Location: Left Arm)   Pulse 77   Temp 98.4 F (36.9 C) (Oral)   Resp 16   Ht 5' 2 (1.575 m)   Wt 59.1 kg   LMP 07/21/2023 (Exact Date)   SpO2 100%   BMI 23.83 kg/m  Physical Examination: Physical Exam Constitutional:      Appearance: Normal appearance.  HENT:     Head: Normocephalic and atraumatic.  Abdominal:     Palpations: Abdomen is soft.     Comments: Gravid  Musculoskeletal:     Cervical back: Normal range of motion and neck supple.  Neurological:     Mental Status: She is alert and oriented to person, place, and time.  Skin:    General: Skin is warm and dry.  Psychiatric:        Mood and Affect: Mood normal.        Behavior: Behavior normal.        Thought Content: Thought content normal.        Judgment: Judgment normal.      Results for orders placed or performed during the hospital encounter of 11/15/23 (from the past 48 hours)  Hepatitis B surface antigen     Status: None   Collection Time: 11/15/23  8:24 PM  Result Value Ref Range   Hepatitis B Surface Ag NON REACTIVE NON REACTIVE    Comment: Performed at [facility], [address]  [place], [place] [address]  RPR     Status: None   Collection Time: 11/15/23  8:24 PM  Result Value Ref Range   RPR Ser Ql NON REACTIVE NON REACTIVE    Comment: Performed at [facility], [address] [place], [place] [address]  HIV Antibody (routine testing w rflx)     Status: None   Collection Time: 11/15/23  8:24 PM  Result Value Ref Range   HIV Screen 4th Generation wRfx Non Reactive Non Reactive    Comment: Performed at [facility], [address], [place], [place] [address]  Hemoglobin A1c     Status: Abnormal   Collection Time: 11/15/23  8:24 PM  Result Value Ref Range   Hgb A1c MFr Bld 4.6 (L) 4.8 - 5.6 %    Comment: (NOTE) Diagnosis of Diabetes The following HbA1c ranges recommended by the American Diabetes Association (ADA) may be used as an aid in the diagnosis of diabetes mellitus.  Hemoglobin             Suggested A1C NGSP%              Diagnosis  <5.7  Non Diabetic  5.7-6.4                Pre-Diabetic  >6.4                   Diabetic  <7.0                   Glycemic control for                       adults with diabetes.     Mean Plasma Glucose 85.32 mg/dL    Comment: Performed at [facility], [address] [place], [place] [address]  TSH     Status: None   Collection Time: 11/15/23  8:24 PM  Result Value Ref Range   TSH 1.219 0.350 - 4.500 uIU/mL    Comment: Performed by a 3rd Generation assay with a functional sensitivity of <=0.01 uIU/mL. Performed at [facility], [address] [place], [place] [address]   Basic metabolic panel with GFR     Status: Abnormal   Collection Time: 11/16/23  5:09 AM  Result Value Ref Range   Sodium 135 135 - 145 mmol/L   Potassium 3.5 3.5 - 5.1 mmol/L   Chloride 107 98 - 111 mmol/L   CO2 23 22 - 32 mmol/L   Glucose, Bld 84 70 - 99 mg/dL    Comment: Glucose reference range applies only to samples taken after fasting for at least 8 hours.   BUN <5 (L) 6 - 20 mg/dL    Creatinine, Ser 9.49 0.44 - 1.00 mg/dL   Calcium 8.0 (L) 8.9 - 10.3 mg/dL   GFR, Estimated >39 >39 mL/min    Comment: (NOTE) Calculated using the CKD-EPI Creatinine Equation (2021)    Anion gap 5 5 - 15    Comment: Performed at [facility], [address] [place], [place] [address]  Magnesium     Status: None   Collection Time: 11/16/23  5:09 AM  Result Value Ref Range   Magnesium 2.0 1.7 - 2.4 mg/dL    Comment: Performed at [facility], [address], [place], [place] [address]  Phosphorus     Status: None   Collection Time: 11/16/23  5:09 AM  Result Value Ref Range   Phosphorus 3.0 2.5 - 4.6 mg/dL    Comment: Performed at [facility], [address] [place], [place] [address]  Ferritin     Status: None   Collection Time: 11/16/23  5:09 AM  Result Value Ref Range   Ferritin 182 11 - 307 ng/mL    Comment: Performed at [facility], [address] [place], [place] [address]  Chlamydia/NGC rt PCR ([facility] only)     Status: None   Collection Time: 11/16/23  9:02 AM   Specimen: Urine  Result Value Ref Range   Specimen source GC/Chlam URINE, RANDOM    Chlamydia Tr NOT DETECTED NOT DETECTED   N gonorrhoeae NOT DETECTED NOT DETECTED    Comment: (NOTE) This CT/NG assay has not been evaluated in patients with a history of  hysterectomy. Performed at [facility], [address] [place], [place] [address]   Basic metabolic panel with GFR     Status: Abnormal   Collection Time: 11/17/23  5:35 AM  Result Value Ref Range   Sodium 137 135 - 145 mmol/L   Potassium 3.1 (L) 3.5 - 5.1 mmol/L   Chloride 105 98 -  111 mmol/L   CO2 24 22 - 32 mmol/L   Glucose, Bld 85 70 - 99 mg/dL    Comment: Glucose reference range applies only to samples taken after fasting for at least 8 hours.   BUN <5 (L) 6 - 20 mg/dL   Creatinine, Ser 9.31 0.44 - 1.00 mg/dL   Calcium 8.4 (L) 8.9 - 10.3 mg/dL   GFR, Estimated >39 >39 mL/min    Comment:  (NOTE) Calculated using the CKD-EPI Creatinine Equation (2021)    Anion gap 8 5 - 15    Comment: Performed at [facility], [address] [place], [place] [address]  Magnesium     Status: None   Collection Time: 11/17/23  5:35 AM  Result Value Ref Range   Magnesium 2.0 1.7 - 2.4 mg/dL    Comment: Performed at [facility], [address] [place], [place] [address]  CBC     Status: Abnormal   Collection Time: 11/17/23  5:35 AM  Result Value Ref Range   WBC 6.3 4.0 - 10.5 K/uL   RBC 3.15 (L) 3.87 - 5.11 MIL/uL   Hemoglobin 7.5 (L) 12.0 - 15.0 g/dL    Comment: Reticulocyte Hemoglobin testing may be clinically indicated, consider ordering this additional test [id number]    HCT 24.1 (L) 36.0 - 46.0 %   MCV 76.5 (L) 80.0 - 100.0 fL   MCH 23.8 (L) 26.0 - 34.0 pg   MCHC 31.1 30.0 - 36.0 g/dL   RDW 87.4 88.4 - 84.4 %   Platelets 230 150 - 400 K/uL   nRBC 0.0 0.0 - 0.2 %    Comment: Performed at [facility], [address] [place], [place] [address]    No results found.  Current scheduled medications  heparin  injection (subcutaneous)  5,000 Units Subcutaneous Q8H   prenatal multivitamin  1 tablet Oral Q1200    I have reviewed the patient's current medications.  ASSESSMENT: Patient Active Problem List   Diagnosis Date Noted   Acute pyelonephritis 11/15/2023   Short interval between pregnancies affecting pregnancy in second trimester, antepartum 08/10/2015   Asthma 08/10/2015   Insufficient prenatal care in first trimester 08/10/2015   IUGR (intrauterine growth restriction) in prior pregnancy, pregnant 08/10/2015   Systemic infection (HCC) 11/18/2014   Acute PN (pyelonephritis) 11/18/2014   Anemia complicating pregnancy in third trimester 06/07/2014   Personal history of urinary infection 01/12/2014   H/O deep venous thrombosis 01/12/2014   Infection due to trichomonas 11/20/2011    PLAN: Hydration & Nausea Management:  Order a banana  bag for hydration to address any potential dehydration related to nausea. -daily weights  Continue Phenergan as needed for nausea, and consider adding Zofran  if symptoms persist or worsen.  Infection Management:  Continue treatment for pyelonephritis as per hospitalist recommendations.  Fetal Monitoring:  Continue daily monitoring of fetal heart tones per doppler, which are currently reassuring at 130 bpm.  Follow up with OB and MFM to assess for any further concerns with the placental anomaly. Close follow-up is advised after discharge to ensure maternal and fetal well-being.  Follow-Up Care:  The patient reports plans to establish care with [facility] for ongoing prenatal care after discharge.  Ensure she has appropriate referrals and follow-up appointments in place for her high-risk pregnancy management.  Patient Education:  Discussed the importance of hydration and managing nausea, including the use of prescribed medications.  Provided information about the need for close monitoring of fetal health post-discharge.  Reinforced the need for follow-up with OB/MFM after discharge due to the suspected placental anomaly and her high-risk pregnancy history.  Amontae Ng, CNM 11/17/2023 10:59 AM  Bobbette Brunswick Certified Nurse Midwife [facility]

## 2023-11-17 NOTE — Progress Notes (Signed)
 PHARMACY CONSULT NOTE - FOLLOW UP  Pharmacy Consult for Electrolyte Monitoring and Replacement   Recent Labs: Potassium (mmol/L)  Date Value  11/17/2023 3.1 (L)   Magnesium (mg/dL)  Date Value  90/71/7974 2.0   Calcium (mg/dL)  Date Value  90/71/7974 8.4 (L)   Albumin (g/dL)  Date Value  90/74/7974 3.5   Phosphorus (mg/dL)  Date Value  90/72/7974 3.0   Sodium (mmol/L)  Date Value  11/17/2023 137    Assessment: 28 y.o. female with medical history significant for asthma, history of lower extremity DVT 10 years ago completed anticoagulation, mild intermittent, history of nephrolithiasis, frequent UTIs, 15 weeks pregnancy came into ED complaining of abdominal pain. Pharmacy is asked to follow and replace electrolytes  Goal of Therapy:  Electrolytes WNL  Plan:  ---K 3.1  MD ordered KCL 40 meq po x1 ---recheck electrolytes in am  Suzann Allean LABOR ,PharmD Clinical Pharmacist 11/17/2023 7:45 AM

## 2023-11-17 NOTE — Plan of Care (Signed)
   Problem: Education: Goal: Knowledge of General Education information will improve Description: Including pain rating scale, medication(s)/side effects and non-pharmacologic comfort measures Outcome: Progressing   Problem: Clinical Measurements: Goal: Will remain free from infection Outcome: Progressing   Problem: Activity: Goal: Risk for activity intolerance will decrease Outcome: Progressing   Problem: Nutrition: Goal: Adequate nutrition will be maintained Outcome: Progressing   Problem: Pain Managment: Goal: General experience of comfort will improve and/or be controlled Outcome: Progressing

## 2023-11-17 NOTE — Progress Notes (Signed)
 Progress Note    Kathryn Johnston  FMW:[id number] DOB: 03-Oct-1995  DOA: 11/15/2023 PCP: Patient, No Pcp Per      Brief Narrative:    Medical records reviewed and are as summarized below:  Jeriah Skufca is a 28 y.o. female, [redacted] weeks pregnant, with medical history significant for, asthma, history of lower extremity DVT 10 years ago, history of nephrolithiasis, frequent UTIs, previous urine culture positive for Enterobacter and E. coli, sensitive to ceftriaxone .  She presented to the hospital with abdominal pain mainly around the right flank and right mid back area, dysuria and nausea.  She was admitted to the hospital for acute pyelonephritis.       Assessment/Plan:   Principal Problem:   Acute pyelonephritis Active Problems:   Personal history of urinary infection   H/O deep venous thrombosis   Asthma    Body mass index is 23.83 kg/m.   Acute right-sided pyelonephritis: Continue IV ceftriaxone .  Analgesics as needed for pain.  No growth on blood cultures thus far. No growth on urine culture collected on 11/16/2023.  However, this was collected after patient had received antibiotics. Unfortunately, urine culture was not ordered on admission.   Mild right hydronephrosis and mild hydroureter, questionable nonobstructing right renal pelvic calculus.  Outpatient follow-up with urologist/obstetrician.   15 weeks pregnancy: Continue daily fetal monitoring.  Appreciate recommendations from OB/GYN team.  Patient encouraged to follow-up at [facility] for routine antenatal care.     Chronic anemia with history of iron deficiency anemia requiring IV iron infusions in the past.  Hemoglobin s 7.5.  She she is asymptomatic.  Patient has been advised to use prenatal vitamins. Ferritin 182.   Mild intermittent asthma: no acute issues.  Bronchodilators as needed.   Hypokalemia: Improved  Diet Order             Diet regular Room service appropriate? Yes;  Fluid consistency: Thin  Diet effective now                                  Consultants: Obstetrician  Procedures: None    Medications:    heparin  injection (subcutaneous)  5,000 Units Subcutaneous Q8H   prenatal multivitamin  1 tablet Oral Q1200   Continuous Infusions:  cefTRIAXone  (ROCEPHIN )  IV 1 g (11/17/23 0506)   dextrose  5 % and 0.45% NaCl 1,000 mL with thiamine 100 mg, folic acid 1 mg, M.V.I. Adult 10 mL infusion     promethazine (PHENERGAN) injection (IM or IVPB) 12.5 mg (11/16/23 1356)     Anti-infectives (From admission, onward)    Start     Dose/Rate Route Frequency Ordered Stop   11/16/23 0500  cefTRIAXone  (ROCEPHIN ) 1 g in sodium chloride  0.9 % 100 mL IVPB        1 g 200 mL/hr over 30 Minutes Intravenous Every 24 hours 11/15/23 1126     11/15/23 0300  cefTRIAXone  (ROCEPHIN ) 1 g in sodium chloride  0.9 % 100 mL IVPB        1 g 200 mL/hr over 30 Minutes Intravenous  Once 11/15/23 0245 11/15/23 0330              Family Communication/Anticipated D/C date and plan/Code Status   DVT prophylaxis: heparin  injection 5,000 Units Start: 11/15/23 1400     Code Status: Not on file  Family Communication: None Disposition Plan: Plan to discharge home   Status is:  Inpatient Remains inpatient appropriate because: Pyelonephritis         Subjective:   Interval events noted.  She complains of right flank pain.  Objective:    Vitals:   11/16/23 1747 11/16/23 1953 11/17/23 0357 11/17/23 0757  BP: 107/70 102/66 (!) 100/56 (!) 90/49  Pulse: 80 89 86 77  Resp: 18 16 16 16   Temp: 98.9 F (37.2 C) 98.1 F (36.7 C) 98.4 F (36.9 C) 98.4 F (36.9 C)  TempSrc: Oral Oral Oral Oral  SpO2: 100% 100% 98% 100%  Weight:      Height:       No data found.   Intake/Output Summary (Last 24 hours) at 11/17/2023 1231 Last data filed at 11/16/2023 1900 Gross per 24 hour  Intake 480 ml  Output --  Net 480 ml   Filed Weights    11/14/23 2241 11/15/23 0634  Weight: 68 kg 59.1 kg    Exam:   GEN: NAD SKIN: No rash EYES: No pallor or icterus ENT: MMM CV: RRR PULM: CTA B ABD: soft, ND, right flank tenderness, +BS CNS: AAO x 3, non focal EXT: No edema or tenderness GU: Right CVA tenderness      Data Reviewed:   I have personally reviewed following labs and imaging studies:  Labs: Labs show the following:   Basic Metabolic Panel: Recent Labs  Lab 11/14/23 2242 11/16/23 0509 11/17/23 0535  NA 135 135 137  K 3.1* 3.5 3.1*  CL 102 107 105  CO2 21* 23 24  GLUCOSE 110* 84 85  BUN 5* <5* <5*  CREATININE 0.73 0.50 0.68  CALCIUM 9.0 8.0* 8.4*  MG  --  2.0 2.0  PHOS  --  3.0  --    GFR Estimated Creatinine Clearance: 82.1 mL/min (by C-G formula based on SCr of 0.68 mg/dL). Liver Function Tests: Recent Labs  Lab 11/14/23 2242  AST 15  ALT 9  ALKPHOS 35*  BILITOT 0.5  PROT 7.3  ALBUMIN 3.5   Recent Labs  Lab 11/14/23 2240/12/20  LIPASE 16   No results for input(s): AMMONIA in the last 168 hours. Coagulation profile No results for input(s): INR, PROTIME in the last 168 hours.  CBC: Recent Labs  Lab 11/14/23 2242 11/17/23 0535  WBC 10.7* 6.3  HGB 10.1* 7.5*  HCT 32.1* 24.1*  MCV 77.2* 76.5*  PLT 297 230   Cardiac Enzymes: No results for input(s): CKTOTAL, CKMB, CKMBINDEX, TROPONINI in the last 168 hours. BNP (last 3 results) No results for input(s): PROBNP in the last 8760 hours. CBG: No results for input(s): GLUCAP in the last 168 hours. D-Dimer: No results for input(s): DDIMER in the last 72 hours. Hgb A1c: Recent Labs    11/15/23 December 21, 2022  HGBA1C 4.6*   Lipid Profile: No results for input(s): CHOL, HDL, LDLCALC, TRIG, CHOLHDL, LDLDIRECT in the last 72 hours. Thyroid  function studies: Recent Labs    11/15/23 12/21/2022  TSH 1.219   Anemia work up: Recent Labs    11/16/23 0509  FERRITIN 182   Sepsis Labs: Recent Labs  Lab 11/14/23 2242  11/15/23 0434 11/17/23 0535  WBC 10.7*  --  6.3  LATICACIDVEN  --  1.0  0.5  --     Microbiology Recent Results (from the past 240 hours)  Culture, blood (routine x 2)     Status: None (Preliminary result)   Collection Time: 11/15/23  4:33 AM   Specimen: BLOOD  Result Value Ref Range Status   Specimen Description  BLOOD RIGHT ANTECUBITAL  Final   Special Requests   Final    BOTTLES DRAWN AEROBIC AND ANAEROBIC Blood Culture adequate volume   Culture   Final    NO GROWTH 2 DAYS Performed at [facility], [address] [place], [place] [address]    Report Status PENDING  Incomplete  Culture, blood (routine x 2)     Status: None (Preliminary result)   Collection Time: 11/15/23  4:34 AM   Specimen: BLOOD  Result Value Ref Range Status   Specimen Description BLOOD LEFT ANTECUBITAL  Final   Special Requests   Final    BOTTLES DRAWN AEROBIC AND ANAEROBIC Blood Culture adequate volume   Culture   Final    NO GROWTH 2 DAYS Performed at [facility], [address] [place], [place] [address]    Report Status PENDING  Incomplete  Chlamydia/NGC rt PCR ([facility] only)     Status: None   Collection Time: 11/16/23  9:02 AM   Specimen: Urine  Result Value Ref Range Status   Specimen source GC/Chlam URINE, RANDOM  Final   Chlamydia Tr NOT DETECTED NOT DETECTED Final   N gonorrhoeae NOT DETECTED NOT DETECTED Final    Comment: (NOTE) This CT/NG assay has not been evaluated in patients with a history of  hysterectomy. Performed at [facility], [address] [place], [place] [address]   Urine Culture (for pregnant, neutropenic or urologic patients or patients with an indwelling urinary catheter)     Status: None   Collection Time: 11/16/23  9:02 AM   Specimen: Urine, Clean Catch  Result Value Ref Range Status   Specimen Description   Final    URINE, CLEAN CATCH Performed at [facility], [address], [place], [place] [address]    Special  Requests   Final    NONE Performed at [facility], [address] [place], [place] [address]    Culture   Final    NO GROWTH Performed at [facility], [address] [place], [place] [address]    Report Status 11/17/2023 FINAL  Final    Procedures and diagnostic studies:  No results found.              LOS: 1 day   Suede Greenawalt  [facility]   Pager on [email or web address]. If 7PM-7AM, please contact night-coverage at [email or web address]     11/17/2023, 12:31 PM

## 2023-11-17 NOTE — Plan of Care (Signed)

## 2023-11-18 DIAGNOSIS — N10 Acute tubulo-interstitial nephritis: Secondary | ICD-10-CM | POA: Diagnosis not present

## 2023-11-18 LAB — CBC WITH DIFFERENTIAL/PLATELET
Abs Immature Granulocytes: 0.05 K/uL (ref 0.00–0.07)
Basophils Absolute: 0 K/uL (ref 0.0–0.1)
Basophils Relative: 0 %
Eosinophils Absolute: 0 K/uL (ref 0.0–0.5)
Eosinophils Relative: 0 %
HCT: 25.2 % — ABNORMAL LOW (ref 36.0–46.0)
Hemoglobin: 8.1 g/dL — ABNORMAL LOW (ref 12.0–15.0)
Immature Granulocytes: 1 %
Lymphocytes Relative: 46 %
Lymphs Abs: 3.1 K/uL (ref 0.7–4.0)
MCH: 24.5 pg — ABNORMAL LOW (ref 26.0–34.0)
MCHC: 32.1 g/dL (ref 30.0–36.0)
MCV: 76.1 fL — ABNORMAL LOW (ref 80.0–100.0)
Monocytes Absolute: 0.6 K/uL (ref 0.1–1.0)
Monocytes Relative: 9 %
Neutro Abs: 3 K/uL (ref 1.7–7.7)
Neutrophils Relative %: 44 %
Platelets: 277 K/uL (ref 150–400)
RBC: 3.31 MIL/uL — ABNORMAL LOW (ref 3.87–5.11)
RDW: 12.4 % (ref 11.5–15.5)
WBC: 6.8 K/uL (ref 4.0–10.5)
nRBC: 0 % (ref 0.0–0.2)

## 2023-11-18 LAB — BASIC METABOLIC PANEL WITH GFR
Anion gap: 8 (ref 5–15)
BUN: <5 mg/dL — ABNORMAL LOW (ref 6–20)
CO2: 25 mmol/L (ref 22–32)
Calcium: 8.9 mg/dL (ref 8.9–10.3)
Chloride: 108 mmol/L (ref 98–111)
Creatinine, Ser: 0.57 mg/dL (ref 0.44–1.00)
GFR, Estimated: >60 mL/min (ref >60)
Glucose, Bld: 80 mg/dL (ref 70–99)
Potassium: 3.7 mmol/L (ref 3.5–5.1)
Sodium: 141 mmol/L (ref 135–145)

## 2023-11-18 MED ORDER — PRENATAL MULTIVITAMIN CH: 1.0000 | ORAL_TABLET | Freq: Every day | ORAL | Status: AC

## 2023-11-18 MED ORDER — CEPHALEXIN 500 MG PO CAPS: 500.0000 mg | ORAL_CAPSULE | Freq: Four times a day (QID) | ORAL | 0 refills | Status: AC

## 2023-11-18 NOTE — Discharge Summary (Signed)
 Physician Discharge Summary   Patient: Kathryn Johnston MRN: [id number] DOB: 08/10/1995  Admit date:     11/15/2023  Discharge date: 11/18/23  Discharge Physician: AIDA CHO   PCP: Patient, No Pcp Per   Recommendations at discharge:   Establish care with obstetrician at [facility] center. Follow-up with PCP in 1 to 2 weeks  Discharge Diagnoses: Principal Problem:   Acute pyelonephritis Active Problems:   Personal history of urinary infection   H/O deep venous thrombosis   Asthma  Resolved Problems:   * No resolved hospital problems. [facility] Course:  Kathryn Johnston is a 28 y.o. female, [redacted] weeks pregnant, with medical history significant for, asthma, history of lower extremity DVT 10 years ago, history of nephrolithiasis, frequent UTIs, previous urine culture positive for Enterobacter and E. coli, sensitive to ceftriaxone .  She presented to the hospital with abdominal pain mainly around the right flank and right mid back area, dysuria and nausea.   She was admitted to the hospital for acute pyelonephritis.      Assessment and Plan:  Acute right-sided pyelonephritis: S/p treatment with 4 days of IV ceftriaxone .  She will be discharged on Keflex  for 3 more days.  No growth on blood cultures thus far. No growth on urine culture collected on 11/16/2023.  However, this was collected after patient had received antibiotics. Unfortunately, urine culture was not ordered on admission.   Mild right hydronephrosis and mild hydroureter, questionable nonobstructing right renal pelvic calculus.  Outpatient follow-up with urologist/obstetrician.     17 weeks pregnancy: She will be discharged on prenatal vitamins.  Appreciate recommendations from OB/GYN team.  Patient encouraged to follow-up at [facility] system for routine antenatal care.       Chronic anemia with history of iron deficiency anemia requiring IV iron infusions in the past.  Hemoglobin s 7.5.  She  she is asymptomatic.  Patient has been advised to use prenatal vitamins. Ferritin 182.     Mild intermittent asthma: no acute issues.  Bronchodilators as needed.     Hypokalemia: Improved   Her condition has improved and she is deemed stable for discharge home today.      Consultants: Obstetrician Procedures performed: None Disposition: Home Diet recommendation:  Discharge Diet Orders (From admission, onward)     Start     Ordered   11/18/23 0000  Diet - low sodium heart healthy        11/18/23 1007           Cardiac diet DISCHARGE MEDICATION: Allergies as of 11/18/2023       Reactions   Ibuprofen Hives        Medication List     TAKE these medications    cephALEXin  500 MG capsule Commonly known as: KEFLEX  Take 1 capsule (500 mg total) by mouth 4 (four) times daily for 3 days. Start taking on: November 19, 2023   prenatal multivitamin Tabs tablet Take 1 tablet by mouth daily at 12 noon.        Follow-up Information     [facility]. Schedule an appointment as soon as possible for a visit.   Why: as soon as possible for prenatal care Contact information: [address] [place]  [address] [phone]        [facility] DEPT Follow up.   Why: Patient states she already has a scheduled appointment for October 6th Contact information: [address] [place]   [id number] [phone]               Discharge Exam: Filed Weights   11/14/23 2241 11/15/23 0634 11/18/23 0445  Weight: 68 kg 59.1 kg 63.7 kg   GEN: NAD SKIN: Warm and dry EYES: No pallor no icterus or uterus ENT: MMM CV: RRR PULM: CTA B ABD: soft, mildly distended from gravid uterus, NT, +BS CNS: AAO x 3, non focal EXT: No edema or tenderness   Condition at discharge: good  The results of significant diagnostics from this hospitalization (including imaging, microbiology, ancillary and  laboratory) are listed below for reference.   Imaging Studies: MR ABDOMEN WO CONTRAST Addendum Date: 11/15/2023 ADDENDUM REPORT: 11/15/2023 10:43 ADDENDUM: Critical Value/emergent results were called by telephone at the time of interpretation on 11/15/2023 at 10:42 am to Dr. Debby Dinsmore and Dr. Roann, Who verbally acknowledged these results. Electronically Signed   By: Ree Molt M.D.   On: 11/15/2023 10:43   Result Date: 11/15/2023 CLINICAL DATA:  Pregnant, ultrasound showed mild hydronephrosis, history of nephrolithiasis with flank pain, right back pain and abdominal pain. EXAM: MRI ABDOMEN AND PELVIS WITHOUT CONTRAST TECHNIQUE: Multiplanar multisequence MR imaging of the abdomen and pelvis was performed. No intravenous contrast was administered. COMPARISON:  Renal ultrasound from earlier the same day and CT scan renal stone protocol from 08/31/2019. FINDINGS: COMBINED FINDINGS FOR BOTH MR ABDOMEN AND PELVIS Lower chest: Unremarkable MR appearance to the lung bases. No pleural effusion. No pericardial effusion. Normal heart size. Hepatobiliary: Liver is normal in size. Noncirrhotic configuration. No focal lesion. No intrahepatic or extrahepatic bile duct dilatation. No choledocholithiasis. Unremarkable gallbladder. Pancreas: No mass, inflammatory changes or other parenchymal abnormality identified. No main pancreatic duct dilation. Spleen:  Within normal limits in size and appearance. No focal mass. Adrenals/Urinary Tract: Unremarkable adrenal glands. There is a subcentimeter simple cortical cyst in the right kidney upper pole, anteriorly (series 9, image 34). There also several scattered up to 1 cm sized sinus cysts throughout bilateral kidneys, mainly on the right. Bilateral extrarenal pelvis noted. There is mild hydronephrosis on the right. There is an approximately 7 x 10 mm filling defect in the right renal pelvis which may represent artifact versus nonobstructing renal pelvic calculus.  There is also mild dilation of the right upper ureter measuring up to 7-8 mm in diameter. However, there is moderate-to-severe compression of the right mid ureter between the gravid uterus and right psoas muscle, which is likely the cause of hydronephrosis. Right ureter distal to this is nondilated. Stomach/Bowel: Visualized portions within the abdomen are unremarkable. No disproportionate dilation of bowel loops. Vascular/Lymphatic: No pathologically enlarged lymph nodes identified. No abdominal aortic aneurysm demonstrated. No ascites. Reproductive organs: Note is made of gravid uterus containing a single fetus. Please note this examination was not tailored for evaluation of the fetus. However, having said that, note is made of focal infiltrative appearing T2 mildly hypointense area in the retroplacental region along the left lower side of the uterus. At that level, the placenta measures up to 1.7 cm in thickness and the retroplacental T2 hypointense infiltrative area measures up 4.9 cm in thickness. There is also apparent bulging of the uterine myometrium at this level, outside the normal external contour. This is of unknown etiology. Differential diagnosis favors focal myometrial contraction at this level. However, underlying infiltrative uterine lesion such as leiomyoma can not be completely excluded. Further evaluation with limited ultrasound with attention to the retroplacental area is recommended. In case of similar appearing focal bulge  on ultrasound repeat examination can be done in 30 minutes or so to try to demonstrate, if this focal contraction disappears. If not, other etiologies should be considered. Bilateral ovaries are visualized and appears within normal limits. Other:  None. Musculoskeletal: No suspicious bone lesions identified. IMPRESSION: 1. Mild hydronephrosis and upper hydroureter on the right side, which is most likely secondary to compression of the mid ureter between the gravid uterus and  right psoas muscle. There is an approximately 7 x 10 mm filling defect in the right renal pelvis, which may represent artifact versus nonobstructing renal pelvic calculus. 2. There is a focal infiltrative appearing T2 mildly hypointense area in the retroplacental region along the left lower side of the uterus. There is also apparent bulging of the uterine myometrium at this level, outside the normal external contour. This is of unknown etiology. Differential diagnosis favors focal myometrial contraction at this level. However, underlying infiltrative uterine lesion such as leiomyoma can not be completely excluded. Further evaluation with limited ultrasound with attention to the retroplacental area is recommended. In case of similar appearing focal bulge on ultrasound repeat examination can be done in 30 minutes or so to try to demonstrate, if this focal contraction disappears. If not, other etiologies should be considered. 3. Otherwise unremarkable exam. Electronically Signed: By: Ree Molt M.D. On: 11/15/2023 10:18   MR PELVIS WO CONTRAST Addendum Date: 11/15/2023 ADDENDUM REPORT: 11/15/2023 10:43 ADDENDUM: Critical Value/emergent results were called by telephone at the time of interpretation on 11/15/2023 at 10:42 am to Dr. Debby Dinsmore and Dr. Roann, Who verbally acknowledged these results. Electronically Signed   By: Ree Molt M.D.   On: 11/15/2023 10:43   Result Date: 11/15/2023 CLINICAL DATA:  Pregnant, ultrasound showed mild hydronephrosis, history of nephrolithiasis with flank pain, right back pain and abdominal pain. EXAM: MRI ABDOMEN AND PELVIS WITHOUT CONTRAST TECHNIQUE: Multiplanar multisequence MR imaging of the abdomen and pelvis was performed. No intravenous contrast was administered. COMPARISON:  Renal ultrasound from earlier the same day and CT scan renal stone protocol from 08/31/2019. FINDINGS: COMBINED FINDINGS FOR BOTH MR ABDOMEN AND PELVIS Lower chest: Unremarkable MR  appearance to the lung bases. No pleural effusion. No pericardial effusion. Normal heart size. Hepatobiliary: Liver is normal in size. Noncirrhotic configuration. No focal lesion. No intrahepatic or extrahepatic bile duct dilatation. No choledocholithiasis. Unremarkable gallbladder. Pancreas: No mass, inflammatory changes or other parenchymal abnormality identified. No main pancreatic duct dilation. Spleen:  Within normal limits in size and appearance. No focal mass. Adrenals/Urinary Tract: Unremarkable adrenal glands. There is a subcentimeter simple cortical cyst in the right kidney upper pole, anteriorly (series 9, image 34). There also several scattered up to 1 cm sized sinus cysts throughout bilateral kidneys, mainly on the right. Bilateral extrarenal pelvis noted. There is mild hydronephrosis on the right. There is an approximately 7 x 10 mm filling defect in the right renal pelvis which may represent artifact versus nonobstructing renal pelvic calculus. There is also mild dilation of the right upper ureter measuring up to 7-8 mm in diameter. However, there is moderate-to-severe compression of the right mid ureter between the gravid uterus and right psoas muscle, which is likely the cause of hydronephrosis. Right ureter distal to this is nondilated. Stomach/Bowel: Visualized portions within the abdomen are unremarkable. No disproportionate dilation of bowel loops. Vascular/Lymphatic: No pathologically enlarged lymph nodes identified. No abdominal aortic aneurysm demonstrated. No ascites. Reproductive organs: Note is made of gravid uterus containing a single fetus. Please note this examination was  not tailored for evaluation of the fetus. However, having said that, note is made of focal infiltrative appearing T2 mildly hypointense area in the retroplacental region along the left lower side of the uterus. At that level, the placenta measures up to 1.7 cm in thickness and the retroplacental T2 hypointense  infiltrative area measures up 4.9 cm in thickness. There is also apparent bulging of the uterine myometrium at this level, outside the normal external contour. This is of unknown etiology. Differential diagnosis favors focal myometrial contraction at this level. However, underlying infiltrative uterine lesion such as leiomyoma can not be completely excluded. Further evaluation with limited ultrasound with attention to the retroplacental area is recommended. In case of similar appearing focal bulge on ultrasound repeat examination can be done in 30 minutes or so to try to demonstrate, if this focal contraction disappears. If not, other etiologies should be considered. Bilateral ovaries are visualized and appears within normal limits. Other:  None. Musculoskeletal: No suspicious bone lesions identified. IMPRESSION: 1. Mild hydronephrosis and upper hydroureter on the right side, which is most likely secondary to compression of the mid ureter between the gravid uterus and right psoas muscle. There is an approximately 7 x 10 mm filling defect in the right renal pelvis, which may represent artifact versus nonobstructing renal pelvic calculus. 2. There is a focal infiltrative appearing T2 mildly hypointense area in the retroplacental region along the left lower side of the uterus. There is also apparent bulging of the uterine myometrium at this level, outside the normal external contour. This is of unknown etiology. Differential diagnosis favors focal myometrial contraction at this level. However, underlying infiltrative uterine lesion such as leiomyoma can not be completely excluded. Further evaluation with limited ultrasound with attention to the retroplacental area is recommended. In case of similar appearing focal bulge on ultrasound repeat examination can be done in 30 minutes or so to try to demonstrate, if this focal contraction disappears. If not, other etiologies should be considered. 3. Otherwise unremarkable  exam. Electronically Signed: By: Ree Molt M.D. On: 11/15/2023 10:18   US  Renal Result Date: 11/15/2023 EXAM: US  Retroperitoneum Complete, Renal. CLINICAL HISTORY: R flank and back pain, history of kidney stones. TECHNIQUE: Real-time ultrasound of the retroperitoneum (complete) with image documentation. COMPARISON: None provided. FINDINGS: RIGHT KIDNEY: The right kidney measures 10.1 x 4.5 x 6.0 cm with an estimated volume of 144 cc. Renal cortical thickness and echogenicity is within normal limits. Mild hydronephrosis. No intrarenal mass or calcification. LEFT KIDNEY: The left kidney measures 11.1 x 5.5 x 5.5 cm with an estimated volume of 176 cc. Renal cortical thickness and echogenicity is within normal limits. No hydronephrosis. No intrarenal mass or calcification. BLADDER: Unremarkable as visualized. IMPRESSION: 1. Mild right hydronephrosis. No intrarenal mass or calcification. Electronically signed by: Dorethia Molt MD 11/15/2023 02:37 AM EDT RP Workstation: [id number]   US  OB Limited Result Date: 11/15/2023 CLINICAL DATA:  Lower abdominal pain EXAM: LIMITED OBSTETRIC ULTRASOUND COMPARISON:  None Available. FINDINGS: Number of Fetuses: 1 Heart Rate:  155 bpm Movement: Yes Presentation: Variable Placental Location: Fundal, posterior Previa: No Amniotic Fluid (Subjective):  Within normal limits. AFI:  cm BPD: 3.1 cm 15 w  5 d MATERNAL FINDINGS: Cervix:  Appears closed. Uterus/Adnexae: No abnormality visualized. IMPRESSION: Approximately 15 week 5 day intrauterine pregnancy. Fetal heart rate 155 beats per minute. No acute maternal findings. This exam is performed on an emergent basis and does not comprehensively evaluate fetal size, dating, or anatomy; follow-up complete OB US  should  be considered if further fetal assessment is warranted. Electronically Signed   By: Franky Crease M.D.   On: 11/15/2023 01:08    Microbiology: Results for orders placed or performed during the hospital encounter of  11/15/23  Culture, blood (routine x 2)     Status: None (Preliminary result)   Collection Time: 11/15/23  4:33 AM   Specimen: BLOOD  Result Value Ref Range Status   Specimen Description BLOOD RIGHT ANTECUBITAL  Final   Special Requests   Final    BOTTLES DRAWN AEROBIC AND ANAEROBIC Blood Culture adequate volume   Culture   Final    NO GROWTH 3 DAYS Performed at [facility], [address], [place], [place] [address]    Report Status PENDING  Incomplete  Culture, blood (routine x 2)     Status: None (Preliminary result)   Collection Time: 11/15/23  4:34 AM   Specimen: BLOOD  Result Value Ref Range Status   Specimen Description BLOOD LEFT ANTECUBITAL  Final   Special Requests   Final    BOTTLES DRAWN AEROBIC AND ANAEROBIC Blood Culture adequate volume   Culture   Final    NO GROWTH 3 DAYS Performed at [facility], [address] [place], [place] [address]    Report Status PENDING  Incomplete  Chlamydia/NGC rt PCR ([facility] only)     Status: None   Collection Time: 11/16/23  9:02 AM   Specimen: Urine  Result Value Ref Range Status   Specimen source GC/Chlam URINE, RANDOM  Final   Chlamydia Tr NOT DETECTED NOT DETECTED Final   N gonorrhoeae NOT DETECTED NOT DETECTED Final    Comment: (NOTE) This CT/NG assay has not been evaluated in patients with a history of  hysterectomy. Performed at [facility], [address], [place], [place] [address]   Urine Culture (for pregnant, neutropenic or urologic patients or patients with an indwelling urinary catheter)     Status: None   Collection Time: 11/16/23  9:02 AM   Specimen: Urine, Clean Catch  Result Value Ref Range Status   Specimen Description   Final    URINE, CLEAN CATCH Performed at [facility], [address], [place], [place] [address]    Special Requests   Final    NONE Performed at [facility], [address], [place], [place] [address]    Culture   Final    NO  GROWTH Performed at [facility], [address], [place], [place] [address]    Report Status 11/17/2023 FINAL  Final    Labs: CBC: Recent Labs  Lab 11/14/23 2242 11/17/23 0535 11/18/23 0304  WBC 10.7* 6.3 6.8  NEUTROABS  --   --  3.0  HGB 10.1* 7.5* 8.1*  HCT 32.1* 24.1* 25.2*  MCV 77.2* 76.5* 76.1*  PLT 297 230 277   Basic Metabolic Panel: Recent Labs  Lab 11/14/23 2242 11/16/23 0509 11/17/23 0535 11/18/23 0304  NA 135 135 137 141  K 3.1* 3.5 3.1* 3.7  CL 102 107 105 108  CO2 21* 23 24 25   GLUCOSE 110* 84 85 80  BUN 5* <5* <5* <5*  CREATININE 0.73 0.50 0.68 0.57  CALCIUM 9.0 8.0* 8.4* 8.9  MG  --  2.0 2.0  --   PHOS  --  3.0  --   --    Liver Function Tests: Recent Labs  Lab 11/14/23 2242  AST 15  ALT 9  ALKPHOS 35*  BILITOT 0.5  PROT 7.3  ALBUMIN 3.5   CBG: No results for input(s): GLUCAP in the last 168 hours.  Discharge time spent: greater than 30 minutes.  Signed: AIDA CHO, MD [facility] 11/18/2023

## 2023-11-18 NOTE — Progress Notes (Signed)
 Discharge instructions reviewed with the patient. Ride here to pick up the patient. Patient sent out by wheelchair with belongings

## 2023-11-18 NOTE — Progress Notes (Signed)
 PHARMACY CONSULT NOTE - FOLLOW UP  Pharmacy Consult for Electrolyte Monitoring and Replacement   Recent Labs: Potassium (mmol/L)  Date Value  11/18/2023 3.7   Magnesium (mg/dL)  Date Value  90/71/7974 2.0   Calcium (mg/dL)  Date Value  90/70/7974 8.9   Albumin (g/dL)  Date Value  90/74/7974 3.5   Phosphorus (mg/dL)  Date Value  90/72/7974 3.0   Sodium (mmol/L)  Date Value  11/18/2023 141    Assessment: 28 y.o. female with medical history significant for asthma, history of lower extremity DVT 10 years ago completed anticoagulation, mild intermittent, history of nephrolithiasis, frequent UTIs, 15 weeks pregnancy came into ED complaining of abdominal pain. Pharmacy is asked to follow and replace electrolytes  Goal of Therapy:  Electrolytes WNL  Plan:  ---K 3.7, no supplementation needed at this time ---recheck electrolytes in am  Lum VEAR Mania ,PharmD Clinical Pharmacist 11/18/2023 7:20 AM

## 2023-11-18 NOTE — Plan of Care (Signed)
  Problem: Education: Goal: Knowledge of General Education information will improve Description: Including pain rating scale, medication(s)/side effects and non-pharmacologic comfort measures Outcome: Adequate for Discharge   Problem: Health Behavior/Discharge Planning: Goal: Ability to manage health-related needs will improve Outcome: Adequate for Discharge   Problem: Clinical Measurements: Goal: Ability to maintain clinical measurements within normal limits will improve Outcome: Adequate for Discharge Goal: Will remain free from infection Outcome: Adequate for Discharge Goal: Diagnostic test results will improve Outcome: Adequate for Discharge Goal: Respiratory complications will improve Outcome: Adequate for Discharge Goal: Cardiovascular complication will be avoided Outcome: Adequate for Discharge   Problem: Activity: Goal: Risk for activity intolerance will decrease Outcome: Adequate for Discharge   Problem: Nutrition: Goal: Adequate nutrition will be maintained Outcome: Adequate for Discharge   Problem: Coping: Goal: Level of anxiety will decrease Outcome: Adequate for Discharge   Problem: Elimination: Goal: Will not experience complications related to bowel motility Outcome: Adequate for Discharge Goal: Will not experience complications related to urinary retention Outcome: Adequate for Discharge   Problem: Pain Managment: Goal: General experience of comfort will improve and/or be controlled Outcome: Adequate for Discharge   Problem: Safety: Goal: Ability to remain free from injury will improve Outcome: Adequate for Discharge   Problem: Skin Integrity: Goal: Risk for impaired skin integrity will decrease Outcome: Adequate for Discharge   Problem: Education: Goal: Knowledge of disease or condition will improve Outcome: Adequate for Discharge Goal: Knowledge of the prescribed therapeutic regimen will improve Outcome: Adequate for Discharge   Problem:  Bowel/Gastric: Goal: Occurences of nausea and/or vomiting will decrease Outcome: Adequate for Discharge   Problem: Fluid Volume: Goal: Maintenance of adequate hydration will improve Outcome: Adequate for Discharge   Problem: Nutritional: Goal: Achievement of adequate weight for body size and type will improve Outcome: Adequate for Discharge

## 2023-11-18 NOTE — Progress Notes (Signed)
 RN at bedside for daily doppler. FHT 135. Pt has no OB complaints or questions at this time. Steadman Prosperi L. Geofm, RN BSN 11/18/2023 6:29 AM

## 2023-11-20 LAB — CULTURE, BLOOD (ROUTINE X 2)
Culture: NO GROWTH
Culture: NO GROWTH
Special Requests: ADEQUATE
Special Requests: ADEQUATE

## 2023-11-26 ENCOUNTER — Telehealth: Payer: Self-pay

## 2023-11-26 ENCOUNTER — Other Ambulatory Visit: Payer: Self-pay | Admitting: Certified Nurse Midwife

## 2023-11-26 DIAGNOSIS — O43109 Malformation of placenta, unspecified, unspecified trimester: Secondary | ICD-10-CM

## 2023-12-16 LAB — PANORAMA PRENATAL TEST FULL PANEL:PANORAMA TEST PLUS 5 ADDITIONAL MICRODELETIONS: FETAL FRACTION: 11.9

## 2023-12-25 DIAGNOSIS — Z8759 Personal history of other complications of pregnancy, childbirth and the puerperium: Secondary | ICD-10-CM | POA: Insufficient documentation

## 2023-12-30 ENCOUNTER — Ambulatory Visit: Payer: MEDICAID

## 2023-12-30 ENCOUNTER — Ambulatory Visit: Payer: MEDICAID | Attending: Certified Nurse Midwife

## 2023-12-30 DIAGNOSIS — Z8759 Personal history of other complications of pregnancy, childbirth and the puerperium: Secondary | ICD-10-CM

## 2024-04-01 ENCOUNTER — Ambulatory Visit: Payer: MEDICAID
